# Patient Record
Sex: Male | Born: 1975 | Race: Black or African American | Hispanic: No | Marital: Single | Smoking: Former smoker
Health system: Southern US, Community
[De-identification: ages and names within clinical notes are randomized; demographics above are authoritative.]

## PROBLEM LIST (undated history)

## (undated) DIAGNOSIS — I319 Disease of pericardium, unspecified: Secondary | ICD-10-CM

## (undated) DIAGNOSIS — J45909 Unspecified asthma, uncomplicated: Secondary | ICD-10-CM

## (undated) HISTORY — PX: OTHER SURGICAL HISTORY: SHX169

---

## 1998-12-22 ENCOUNTER — Emergency Department (HOSPITAL_COMMUNITY): Admission: EM | Admit: 1998-12-22 | Discharge: 1998-12-22 | Payer: Self-pay | Admitting: Emergency Medicine

## 1999-02-26 ENCOUNTER — Encounter: Payer: Self-pay | Admitting: Emergency Medicine

## 1999-02-26 ENCOUNTER — Emergency Department (HOSPITAL_COMMUNITY): Admission: EM | Admit: 1999-02-26 | Discharge: 1999-02-26 | Payer: Self-pay | Admitting: Emergency Medicine

## 2000-08-30 ENCOUNTER — Encounter: Payer: Self-pay | Admitting: Emergency Medicine

## 2000-08-30 ENCOUNTER — Emergency Department (HOSPITAL_COMMUNITY): Admission: EM | Admit: 2000-08-30 | Discharge: 2000-08-30 | Payer: Self-pay | Admitting: Emergency Medicine

## 2001-04-06 ENCOUNTER — Emergency Department (HOSPITAL_COMMUNITY): Admission: EM | Admit: 2001-04-06 | Discharge: 2001-04-06 | Payer: Self-pay | Admitting: *Deleted

## 2001-04-06 ENCOUNTER — Encounter: Payer: Self-pay | Admitting: Emergency Medicine

## 2001-04-08 ENCOUNTER — Emergency Department (HOSPITAL_COMMUNITY): Admission: EM | Admit: 2001-04-08 | Discharge: 2001-04-08 | Payer: Self-pay | Admitting: Emergency Medicine

## 2001-06-27 ENCOUNTER — Encounter: Payer: Self-pay | Admitting: Emergency Medicine

## 2001-06-27 ENCOUNTER — Emergency Department (HOSPITAL_COMMUNITY): Admission: EM | Admit: 2001-06-27 | Discharge: 2001-06-27 | Payer: Self-pay | Admitting: Emergency Medicine

## 2002-11-24 ENCOUNTER — Encounter: Payer: Self-pay | Admitting: Emergency Medicine

## 2002-11-24 ENCOUNTER — Emergency Department (HOSPITAL_COMMUNITY): Admission: EM | Admit: 2002-11-24 | Discharge: 2002-11-24 | Payer: Self-pay | Admitting: Emergency Medicine

## 2003-03-15 ENCOUNTER — Emergency Department (HOSPITAL_COMMUNITY): Admission: EM | Admit: 2003-03-15 | Discharge: 2003-03-15 | Payer: Self-pay

## 2003-09-06 ENCOUNTER — Emergency Department (HOSPITAL_COMMUNITY): Admission: EM | Admit: 2003-09-06 | Discharge: 2003-09-06 | Payer: Self-pay | Admitting: Emergency Medicine

## 2003-09-28 ENCOUNTER — Emergency Department (HOSPITAL_COMMUNITY): Admission: EM | Admit: 2003-09-28 | Discharge: 2003-09-28 | Payer: Self-pay | Admitting: Emergency Medicine

## 2003-11-30 ENCOUNTER — Emergency Department (HOSPITAL_COMMUNITY): Admission: EM | Admit: 2003-11-30 | Discharge: 2003-12-01 | Payer: Self-pay | Admitting: Emergency Medicine

## 2004-03-09 ENCOUNTER — Emergency Department (HOSPITAL_COMMUNITY): Admission: AC | Admit: 2004-03-09 | Discharge: 2004-03-10 | Payer: Self-pay

## 2005-02-20 ENCOUNTER — Emergency Department (HOSPITAL_COMMUNITY): Admission: EM | Admit: 2005-02-20 | Discharge: 2005-02-20 | Payer: Self-pay | Admitting: Emergency Medicine

## 2005-06-02 ENCOUNTER — Emergency Department (HOSPITAL_COMMUNITY): Admission: EM | Admit: 2005-06-02 | Discharge: 2005-06-03 | Payer: Self-pay | Admitting: Emergency Medicine

## 2008-06-20 ENCOUNTER — Emergency Department (HOSPITAL_COMMUNITY): Admission: EM | Admit: 2008-06-20 | Discharge: 2008-06-21 | Payer: Self-pay | Admitting: Emergency Medicine

## 2009-10-27 ENCOUNTER — Emergency Department (HOSPITAL_COMMUNITY): Admission: EM | Admit: 2009-10-27 | Discharge: 2009-10-27 | Payer: Self-pay | Admitting: Emergency Medicine

## 2010-12-01 LAB — HEMOCCULT GUIAC POC 1CARD (OFFICE): Fecal Occult Bld: NEGATIVE

## 2010-12-31 ENCOUNTER — Emergency Department (HOSPITAL_COMMUNITY)
Admission: EM | Admit: 2010-12-31 | Discharge: 2010-12-31 | Disposition: A | Discharge status: Home / Self Care | Payer: Self-pay | Attending: Emergency Medicine | Admitting: Emergency Medicine

## 2010-12-31 DIAGNOSIS — K089 Disorder of teeth and supporting structures, unspecified: Secondary | ICD-10-CM | POA: Insufficient documentation

## 2010-12-31 DIAGNOSIS — J45909 Unspecified asthma, uncomplicated: Secondary | ICD-10-CM | POA: Insufficient documentation

## 2010-12-31 DIAGNOSIS — K029 Dental caries, unspecified: Secondary | ICD-10-CM | POA: Insufficient documentation

## 2011-08-19 ENCOUNTER — Emergency Department (INDEPENDENT_AMBULATORY_CARE_PROVIDER_SITE_OTHER)
Admission: EM | Admit: 2011-08-19 | Discharge: 2011-08-19 | Disposition: A | Discharge status: Home / Self Care | Payer: Self-pay | Source: Home / Self Care | Attending: Family Medicine | Admitting: Family Medicine

## 2011-08-19 DIAGNOSIS — M25512 Pain in left shoulder: Secondary | ICD-10-CM

## 2011-08-19 DIAGNOSIS — M25519 Pain in unspecified shoulder: Secondary | ICD-10-CM

## 2011-08-19 MED ORDER — PREDNISONE (PAK) 10 MG PO TABS: 10.0000 mg | ORAL_TABLET | Freq: Every day | ORAL | Status: AC

## 2011-08-19 MED ORDER — ACETAMINOPHEN-CODEINE #3 300-30 MG PO TABS: 1.0000 | ORAL_TABLET | Freq: Four times a day (QID) | ORAL | Status: AC | PRN

## 2011-08-19 NOTE — ED Notes (Signed)
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

## 2011-08-19 NOTE — ED Provider Notes (Signed)
History     CSN: [id number] Arrival date & time: 08/19/2011  8:23 PM   First MD Initiated Contact with Patient 08/19/11 1956      Chief Complaint  Patient presents with  . Shoulder Pain    (Consider location/radiation/quality/duration/timing/severity/associated sxs/prior treatment) HPI Comments: Left shoulder pain. States he has had shoulder problems for years. He is left hand dominant. 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 States his shoulder is very painful . He was trying to work thru it. No numbness or tingling. No specific injury. No treatment pta  The history is provided by the patient.    History reviewed. No pertinent past medical history.  History reviewed. No pertinent past surgical history.  History reviewed. No pertinent family history.  History  Substance Use Topics  . Smoking status: Never Smoker   . Smokeless tobacco: Not on file  . Alcohol Use:       Review of Systems  Constitutional: Negative.   HENT: Negative.   Respiratory: Negative.   Cardiovascular: Negative.   Gastrointestinal: Negative.   Genitourinary: Negative.   Neurological: Negative.     Allergies  Review of patient's allergies indicates no known allergies.  Home Medications   Current Outpatient Rx  Name Route Sig Dispense Refill  . ACETAMINOPHEN-CODEINE #3 300-30 MG PO TABS Oral Take 1-2 tablets by mouth every 6 (six) hours as needed for pain. 20 tablet 0  . PREDNISONE (PAK) 10 MG PO TABS Oral Take 1 tablet (10 mg total) by mouth daily. 6 day pak  Take as directed with food 21 tablet 0    BP 143/99  Pulse 81  Temp 98.4 F (36.9 C)  Resp 20  SpO2 99%  Physical Exam  Constitutional: He appears well-developed and well-nourished. No distress.  Cardiovascular: Normal rate and regular rhythm.   Pulmonary/Chest: Effort normal and breath sounds normal.  Musculoskeletal:       eval of the left shoulder reveals no deformity. Pain with abduction and internal  rotation. Good grip strength. Skin clear. Sensory intact.     ED Course  Procedures (including critical care time) Do not feel xray of benefit at this point. May need mri. Discussed this with the patient and he voiced understanding.  Labs Reviewed - No data to display No results found.   1. Shoulder pain, left       MDM          Randa Spike, MD 08/19/11 2102

## 2011-10-14 ENCOUNTER — Emergency Department (INDEPENDENT_AMBULATORY_CARE_PROVIDER_SITE_OTHER)
Admission: EM | Admit: 2011-10-14 | Discharge: 2011-10-14 | Disposition: A | Discharge status: Home / Self Care | Payer: Self-pay | Source: Home / Self Care | Attending: Family Medicine | Admitting: Family Medicine

## 2011-10-14 ENCOUNTER — Encounter (HOSPITAL_COMMUNITY): Payer: Self-pay

## 2011-10-14 DIAGNOSIS — R51 Headache: Secondary | ICD-10-CM

## 2011-10-14 NOTE — ED Provider Notes (Signed)
History     CSN: [id number]  Arrival date & time 10/14/11  1648   First MD Initiated Contact with Patient 10/14/11 1658      Chief Complaint  Patient presents with  . Headache    (Consider location/radiation/quality/duration/timing/severity/associated sxs/prior treatment) HPI Comments: Jeret presents for evaluation of new onset headaches that started 4 weeks ago. He also reports mild nausea. He denies any visual changes. He denies vomiting. He states he thinks the headaches start after he eats a meal. He denies any numbness, tingling, or weakness. He denies any history of headaches, migraine or otherwise. He also states that he gets a little dizzy when he stands up sometimes after sitting for long periods of time.   Patient is a 36 y.o. male presenting with headaches. The history is provided by the patient.  Headache The primary symptoms include headaches, dizziness and nausea. Primary symptoms do not include syncope, focal weakness or vomiting. The symptoms began more than 1 week ago.  The headache began more than 2 days ago. The headache developed suddenly. Headache is a new problem. The headache is present intermittently. Location/region(s) of the headache: frontal. The headache is associated with eating.  Dizziness also occurs with nausea. Dizziness does not occur with vomiting.     History reviewed. No pertinent past medical history.  History reviewed. No pertinent past surgical history.  No family history on file.  History  Substance Use Topics  . Smoking status: Never Smoker   . Smokeless tobacco: Not on file  . Alcohol Use:       Review of Systems  Constitutional: Negative.   Eyes: Negative.   Respiratory: Negative.   Cardiovascular: Negative.  Negative for syncope.  Gastrointestinal: Positive for nausea. Negative for vomiting.  Genitourinary: Negative.   Musculoskeletal: Negative.   Skin: Negative.   Neurological: Positive for dizziness and headaches. Negative  for focal weakness.    Allergies  Review of patient's allergies indicates no known allergies.  Home Medications  No current outpatient prescriptions on file.  BP 118/84  Pulse 90  Temp(Src) 98.3 F (36.8 C) (Oral)  Resp 16  SpO2 100%  Physical Exam  Nursing note and vitals reviewed. Constitutional: He is oriented to person, place, and time. He appears well-developed and well-nourished.  HENT:  Head: Normocephalic and atraumatic.  Right Ear: Tympanic membrane normal.  Left Ear: Tympanic membrane normal.  Mouth/Throat: Uvula is midline, oropharynx is clear and moist and mucous membranes are normal.  Eyes: Conjunctivae, EOM and lids are normal. Pupils are equal, round, and reactive to light.  Neck: Normal range of motion.  Pulmonary/Chest: Effort normal.  Musculoskeletal: Normal range of motion.  Neurological: He is alert and oriented to person, place, and time.  Skin: Skin is warm and dry.  Psychiatric: His behavior is normal.    ED Course  Procedures (including critical care time)  Labs Reviewed - No data to display No results found.   1. Headache       MDM  Advised Excedrin Migraine and ibuprofen; return if symptoms do not improve, or worsen.       Richardo Priest, MD 10/14/11 1753

## 2011-10-14 NOTE — ED Notes (Signed)
C/o HA , off and on for past 4 weeks; pain feels like a pounding sensation, using motrin foe pain w minimal relief; NAD; c/o sometimes he has dizziness w his HA when he gets up too fast; "  I know I don't drink enough water"

## 2012-11-22 ENCOUNTER — Emergency Department (HOSPITAL_COMMUNITY)
Admission: EM | Admit: 2012-11-22 | Discharge: 2012-11-22 | Disposition: A | Discharge status: Home / Self Care | Payer: Self-pay | Attending: Emergency Medicine | Admitting: Emergency Medicine

## 2012-11-22 ENCOUNTER — Encounter (HOSPITAL_COMMUNITY): Payer: Self-pay | Admitting: Emergency Medicine

## 2012-11-22 DIAGNOSIS — F17200 Nicotine dependence, unspecified, uncomplicated: Secondary | ICD-10-CM | POA: Insufficient documentation

## 2012-11-22 DIAGNOSIS — L723 Sebaceous cyst: Secondary | ICD-10-CM | POA: Insufficient documentation

## 2012-11-22 DIAGNOSIS — L729 Follicular cyst of the skin and subcutaneous tissue, unspecified: Secondary | ICD-10-CM

## 2012-11-22 NOTE — ED Provider Notes (Signed)
History     CSN: [id number]  Arrival date & time 11/22/12  1325   First MD Initiated Contact with Patient 11/22/12 1353      Chief Complaint  Patient presents with  . Breast Mass    (Consider location/radiation/quality/duration/timing/severity/associated sxs/prior treatment) HPI Comments: Patient presents with breast mass on the left chest for approximately a year. Notes to be minimally tender at times. Wife was worried about breast cancer. The area is not increasing in size. It has not drained or been red. Patient denies any medications. He denies any testicular masses or abnormalities. No weight loss. Night sweats noted at times. Patient does not have primary care physician. The onset of this condition was acute. The course is constant. Aggravating factors: none. Alleviating factors: none.    The history is provided by the patient and the spouse.    History reviewed. No pertinent past medical history.  History reviewed. No pertinent past surgical history.  History reviewed. No pertinent family history.  History  Substance Use Topics  . Smoking status: Current Some Day Smoker  . Smokeless tobacco: Not on file  . Alcohol Use: No      Review of Systems  Constitutional: Negative for fever and unexpected weight change.  Gastrointestinal: Negative for nausea and vomiting.  Genitourinary: Negative for penile swelling, scrotal swelling and testicular pain.  Musculoskeletal: Negative for myalgias.  Skin: Negative for color change and rash.    Allergies  Review of patient's allergies indicates no known allergies.  Home Medications  No current outpatient prescriptions on file.  BP 137/91  Pulse 84  Temp(Src) 97.8 F (36.6 C) (Oral)  Resp 20  SpO2 98%  Physical Exam  Nursing note and vitals reviewed. Constitutional: He appears well-developed and well-nourished.  HENT:  Head: Normocephalic and atraumatic.  Eyes: Conjunctivae are normal.  Neck: Normal range of  motion. Neck supple.  Pulmonary/Chest: No respiratory distress. He exhibits no tenderness.  Neurological: He is alert.  Skin: Skin is warm and dry.  Approximately 5 mm subcutaneous nodule without overlying erythema or drainage noted to anterior left chest wall. Nodule is freely movable and nontender. It has a rubbery consistency. No masses palpated beneath the nipple and areola.   Psychiatric: He has a normal mood and affect.    ED Course  Procedures (including critical care time)  Labs Reviewed - No data to display No results found.   1. Subcutaneous cyst     2:00 PM Patient seen and examined.    Vital signs reviewed and are as follows: Filed Vitals:   11/22/12 1339  BP: 137/91  Pulse: 84  Temp: 97.8 F (36.6 C)  Resp: 20   Patient counseled on monitoring and encouraged PCP or dermatology followup. Referrals given.   MDM  Patient with most likely an epidermal subcutaneous cyst. No infections noted. No drainage. Cyst is soft, nontender and freely mobile. Do not suspect breast cancer. Do not suspect gynecomastia.       Renne Crigler, PA-C 11/22/12 1415

## 2012-11-22 NOTE — ED Provider Notes (Signed)
Medical screening examination/treatment/procedure(s) were performed by non-physician practitioner and as supervising physician I was immediately available for consultation/collaboration.  Marwan T Powers, MD 11/22/12 1642 

## 2012-11-22 NOTE — ED Notes (Signed)
Pt reports eraser size round mass to left breast for the last year. Pt states intermittently painful. Denies drainage or fever.

## 2015-06-04 ENCOUNTER — Encounter (HOSPITAL_COMMUNITY): Payer: Self-pay | Admitting: Emergency Medicine

## 2015-06-04 ENCOUNTER — Emergency Department (HOSPITAL_COMMUNITY)
Admission: EM | Admit: 2015-06-04 | Discharge: 2015-06-04 | Disposition: A | Discharge status: Home / Self Care | Payer: Self-pay | Attending: Emergency Medicine | Admitting: Emergency Medicine

## 2015-06-04 ENCOUNTER — Emergency Department (HOSPITAL_COMMUNITY): Payer: Self-pay

## 2015-06-04 DIAGNOSIS — Z720 Tobacco use: Secondary | ICD-10-CM | POA: Insufficient documentation

## 2015-06-04 DIAGNOSIS — J45909 Unspecified asthma, uncomplicated: Secondary | ICD-10-CM | POA: Insufficient documentation

## 2015-06-04 DIAGNOSIS — F419 Anxiety disorder, unspecified: Secondary | ICD-10-CM | POA: Insufficient documentation

## 2015-06-04 DIAGNOSIS — R0789 Other chest pain: Secondary | ICD-10-CM | POA: Insufficient documentation

## 2015-06-04 HISTORY — DX: Unspecified asthma, uncomplicated: J45.909

## 2015-06-04 LAB — COMPREHENSIVE METABOLIC PANEL
ALBUMIN: 3.8 g/dL (ref 3.5–5.0)
ALK PHOS: 49 U/L (ref 38–126)
ALT: 18 U/L (ref 17–63)
AST: 21 U/L (ref 15–41)
Anion gap: 6 (ref 5–15)
BILIRUBIN TOTAL: 0.4 mg/dL (ref 0.3–1.2)
BUN: 11 mg/dL (ref 6–20)
CO2: 25 mmol/L (ref 22–32)
Calcium: 9.2 mg/dL (ref 8.9–10.3)
Chloride: 108 mmol/L (ref 101–111)
Creatinine, Ser: 1.11 mg/dL (ref 0.61–1.24)
GFR calc Af Amer: >60 mL/min (ref >60)
GFR calc non Af Amer: >60 mL/min (ref >60)
GLUCOSE: 103 mg/dL — AB (ref 65–99)
POTASSIUM: 3.4 mmol/L — AB (ref 3.5–5.1)
SODIUM: 139 mmol/L (ref 135–145)
TOTAL PROTEIN: 7.2 g/dL (ref 6.5–8.1)

## 2015-06-04 LAB — URINALYSIS, ROUTINE W REFLEX MICROSCOPIC
Bilirubin Urine: NEGATIVE
Glucose, UA: NEGATIVE mg/dL
HGB URINE DIPSTICK: NEGATIVE
Ketones, ur: NEGATIVE mg/dL
LEUKOCYTES UA: NEGATIVE
Nitrite: NEGATIVE
PROTEIN: NEGATIVE mg/dL
SPECIFIC GRAVITY, URINE: 1.023 (ref 1.005–1.030)
UROBILINOGEN UA: 1 mg/dL (ref 0.0–1.0)
pH: 6.5 (ref 5.0–8.0)

## 2015-06-04 LAB — RAPID URINE DRUG SCREEN, HOSP PERFORMED
AMPHETAMINES: NOT DETECTED
BENZODIAZEPINES: NOT DETECTED
Barbiturates: NOT DETECTED
COCAINE: NOT DETECTED
OPIATES: NOT DETECTED
TETRAHYDROCANNABINOL: NOT DETECTED

## 2015-06-04 LAB — CBC WITH DIFFERENTIAL/PLATELET
BASOS ABS: 0 10*3/uL (ref 0.0–0.1)
BASOS PCT: 1 %
EOS ABS: 0.1 10*3/uL (ref 0.0–0.7)
Eosinophils Relative: 2 %
HEMATOCRIT: 41 % (ref 39.0–52.0)
HEMOGLOBIN: 14.4 g/dL (ref 13.0–17.0)
Lymphocytes Relative: 39 %
Lymphs Abs: 1.7 10*3/uL (ref 0.7–4.0)
MCH: 31.3 pg (ref 26.0–34.0)
MCHC: 35.1 g/dL (ref 30.0–36.0)
MCV: 89.1 fL (ref 78.0–100.0)
Monocytes Absolute: 0.3 10*3/uL (ref 0.1–1.0)
Monocytes Relative: 8 %
NEUTROS ABS: 2.2 10*3/uL (ref 1.7–7.7)
NEUTROS PCT: 50 %
Platelets: 231 10*3/uL (ref 150–400)
RBC: 4.6 MIL/uL (ref 4.22–5.81)
RDW: 12.4 % (ref 11.5–15.5)
WBC: 4.4 10*3/uL (ref 4.0–10.5)

## 2015-06-04 LAB — I-STAT TROPONIN, ED
Troponin i, poc: 0 ng/mL (ref 0.00–0.08)
Troponin i, poc: 0 ng/mL (ref 0.00–0.08)

## 2015-06-04 MED ORDER — KETOROLAC TROMETHAMINE 30 MG/ML IJ SOLN
30.0000 mg | Freq: Once | INTRAMUSCULAR | Status: AC
  Administered 2015-06-04: 30 mg via INTRAVENOUS
  Filled 2015-06-04: qty 1

## 2015-06-04 MED ORDER — ALBUTEROL SULFATE HFA 108 (90 BASE) MCG/ACT IN AERS
1.0000 | INHALATION_SPRAY | Freq: Once | RESPIRATORY_TRACT | Status: AC
  Administered 2015-06-04: 1 via RESPIRATORY_TRACT
  Filled 2015-06-04: qty 6.7

## 2015-06-04 MED ORDER — CYCLOBENZAPRINE HCL 10 MG PO TABS: 10.0000 mg | ORAL_TABLET | Freq: Two times a day (BID) | ORAL | Status: DC | PRN

## 2015-06-04 MED ORDER — IBUPROFEN 600 MG PO TABS: 600.0000 mg | ORAL_TABLET | Freq: Four times a day (QID) | ORAL | Status: DC | PRN

## 2015-06-04 NOTE — ED Notes (Signed)
Per EMS: cp onset 0900, constant, non-radiating but pt reports left arm aching, states its hard to breath at times, been under a lot of stress at work and at home.  VSS, no cardiac hx, states he was diaphoretic at work today.  Hx of asthma

## 2015-06-04 NOTE — Discharge Instructions (Signed)

## 2015-06-04 NOTE — ED Provider Notes (Signed)
CSN: [id number]     Arrival date & time 06/04/15  1704 History   First MD Initiated Contact with Patient 06/04/15 1705     Chief Complaint  Patient presents with  . Chest Pain   Patient is a 39 y.o. male presenting with general illness. The history is provided by the patient. No language interpreter was used.  Illness Location:  Chest Quality:  Pain Severity:  Moderate Onset quality:  Gradual Timing:  Constant Progression:  Unchanged Chronicity:  New Context:  PMHx of asthma presenting with CP. Onset at 9am this morning while at rest. Nonradiating. Not exertional. Worse with deep inspiration, movement of arm, and palpation. PERC (-). Denies SOB, nausea, diaphoresis. No PMHx of HTN, HLD, DM, or TOB use. No FHx of early MI. Known asthma as noted above but denies recent cough or wheezing and patient states this does not feel like his asthma flares. Patient has been under increased stress at work and at home recently. Associated symptoms: chest pain and myalgias (L arm)   Associated symptoms: no cough, no diarrhea, no fever, no nausea, no rash, no shortness of breath and no vomiting     Past Medical History  Diagnosis Date  . Asthma    No past surgical history on file. No family history on file. Social History  Substance Use Topics  . Smoking status: Current Some Day Smoker  . Smokeless tobacco: None  . Alcohol Use: No    Review of Systems  Constitutional: Negative for fever and chills.  Respiratory: Negative for cough and shortness of breath.   Cardiovascular: Positive for chest pain. Negative for palpitations and leg swelling.  Gastrointestinal: Negative for nausea, vomiting, diarrhea, constipation and abdominal distention.  Genitourinary: Negative for dysuria, urgency, frequency and hematuria.  Musculoskeletal: Positive for myalgias (L arm) and arthralgias (L arm). Negative for back pain, joint swelling and gait problem.  Skin: Negative for rash.  All other systems reviewed  and are negative.   Allergies  Review of patient's allergies indicates no known allergies.  Home Medications   Prior to Admission medications   Medication Sig Start Date End Date Taking? Authorizing Zan Orlick  cyclobenzaprine (FLEXERIL) 10 MG tablet Take 1 tablet (10 mg total) by mouth 2 (two) times daily as needed for muscle spasms. 06/04/15   Angelina Ok, MD  ibuprofen (ADVIL,MOTRIN) 600 MG tablet Take 1 tablet (600 mg total) by mouth every 6 (six) hours as needed. 06/04/15   Angelina Ok, MD   BP 117/85 mmHg  Pulse 74  Temp(Src) 98.3 F (36.8 C) (Oral)  Resp 18  Ht  (1.778 m)  Wt 185 lb (83.915 kg)  BMI 26.54 kg/m2  SpO2 100%   Physical Exam  Constitutional: He is oriented to person, place, and time. He appears well-developed and well-nourished. No distress.  HENT:  Head: Normocephalic and atraumatic.  Eyes: Conjunctivae are normal. Pupils are equal, round, and reactive to light.  Neck: Normal range of motion. Neck supple.  Cardiovascular: Normal rate, regular rhythm and intact distal pulses.   Pulmonary/Chest: Effort normal and breath sounds normal. No respiratory distress. He has no wheezes. He exhibits tenderness (L costochondral junction as well as entire left pectoralis muscle).  Abdominal: Soft. Bowel sounds are normal. He exhibits no distension. There is no tenderness.  Musculoskeletal: Normal range of motion.  Neurological: He is alert and oriented to person, place, and time.  Skin: Skin is warm and dry. He is not diaphoretic.  Nursing note and vitals reviewed.  ED Course  Procedures   Labs Review Labs Reviewed  COMPREHENSIVE METABOLIC PANEL - Abnormal; Notable for the following:    Potassium 3.4 (*)    Glucose, Bld 103 (*)    All other components within normal limits  CBC WITH DIFFERENTIAL/PLATELET  URINALYSIS, ROUTINE W REFLEX MICROSCOPIC (NOT AT [facility])  URINE RAPID DRUG SCREEN, HOSP PERFORMED  I-STAT TROPOININ, ED  Rosezena Sensor, ED     Imaging Review Dg Chest 2 View  06/04/2015   CLINICAL DATA:  Chest pain x 1 day  EXAM: CHEST  2 VIEW  COMPARISON:  06/21/2008  FINDINGS: The heart size and mediastinal contours are within normal limits. Both lungs are clear. The visualized skeletal structures are unremarkable.  IMPRESSION: No active cardiopulmonary disease.   Electronically Signed   By: Norva Pavlov M.D.   On: 06/04/2015 18:08   I have personally reviewed and evaluated these images and lab results as part of my medical decision-making.   EKG Interpretation   Date/Time:  Thursday June 04 2015 17:09:03 EDT Ventricular Rate:  89 PR Interval:  180 QRS Duration: 84 QT Interval:  324 QTC Calculation: 394 R Axis:   52 Text Interpretation:  Sinus rhythm Artifact in lead(s) I II III aVR aVL  aVF V1 V2 No significant change since last tracing Confirmed by Gwendolyn Grant   MD, BLAIR ([id number]) on 06/04/2015 5:11:46 PM      MDM  Mr. Docken is a 39 yo male w/ PMHx of asthma presenting with CP. Onset at 9am this morning while at rest. Nonradiating. Not exertional. Worse with deep inspiration, movement of arm, and palpation. PERC (-). Denies SOB, nausea, diaphoresis. No PMHx of HTN, HLD, DM, or TOB use. No FHx of early MI. Known asthma as noted above but denies recent cough or wheezing and patient states this does not feel like his asthma flares. Patient has been under increased stress at work and at home recently.  Exam above notable for middle-age male lying in stretcher in no acute distress. Afebrile. Heart rate 80s. Normotensive. Breathing well on room air and maintaining saturations in high 90s. Lungs clear to auscultation bilaterally. Tenderness to palpation of left costochondral junction as well as left pectoralis muscle. Patient's pain reproducible with range of motion of left shoulder.  Patient risk stratified to low risk using heart score. EKG showing no ST elevation/depression or T-wave inversions and unchanged from previous  EKG. I-STAT troponin undetectable and follow-up troponin 3 hours following is undetectable. CBC and CMP noncontributory. UA negative for blood or severe dehydration or infection. UDS negative. Chest x-ray showing no acute carpal disease.  Symptoms most likely related to musculoskeletal chest pain versus anxiety. Patient given Toradol in the ED with moderate relief in pain. Patient discharged home with prescription for muscle relaxants and a referral to PCP to further discuss medication regimen to control his anxiety. Strict ED return precautions dicussed. Pt understands and agrees with the plan and has no further questions or concerns.   Pt care discussed with and followed by my attending, Dr. Thurmon Fair, MD Pager [phone]   Final diagnoses:  Left-sided chest wall pain  Anxiety    Angelina Ok, MD 06/04/15 2340  Elwin Mocha, MD 06/05/15 [phone]

## 2015-06-04 NOTE — ED Notes (Signed)
Pt. Left with all belongings and refused wheelchair. Discharge instructions were reviewed and all questions were answered.  

## 2015-06-04 NOTE — ED Notes (Signed)
  Patient returned from xray, monitor applied.

## 2016-10-15 IMAGING — DX DG CHEST 2V
2 series · 2 of 2 positions shown · non-contrast
Comparison: 06/21/2008

CLINICAL DATA: Chest pain x 1 day

EXAM:
CHEST  2 VIEW

[chest pa]
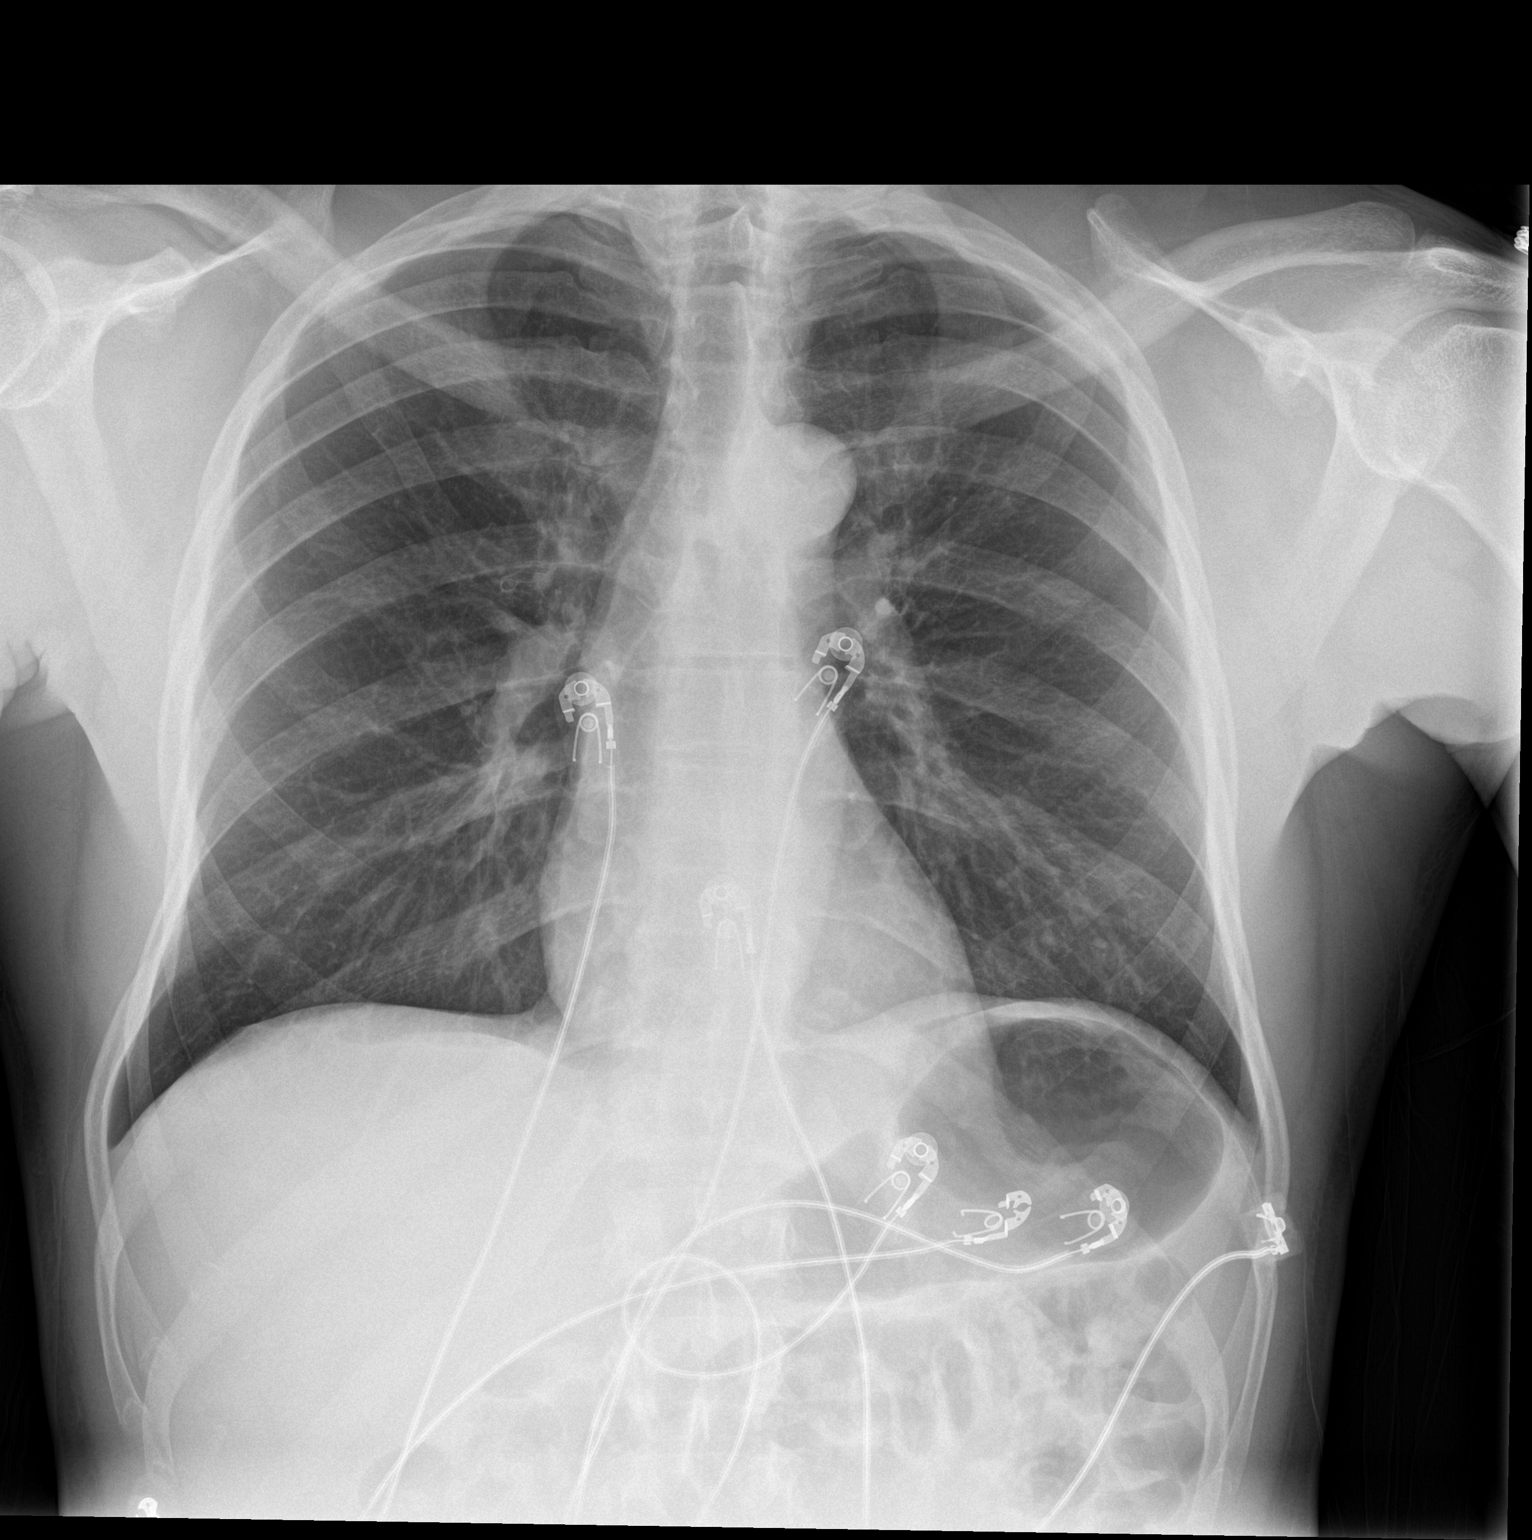

[chest lat]
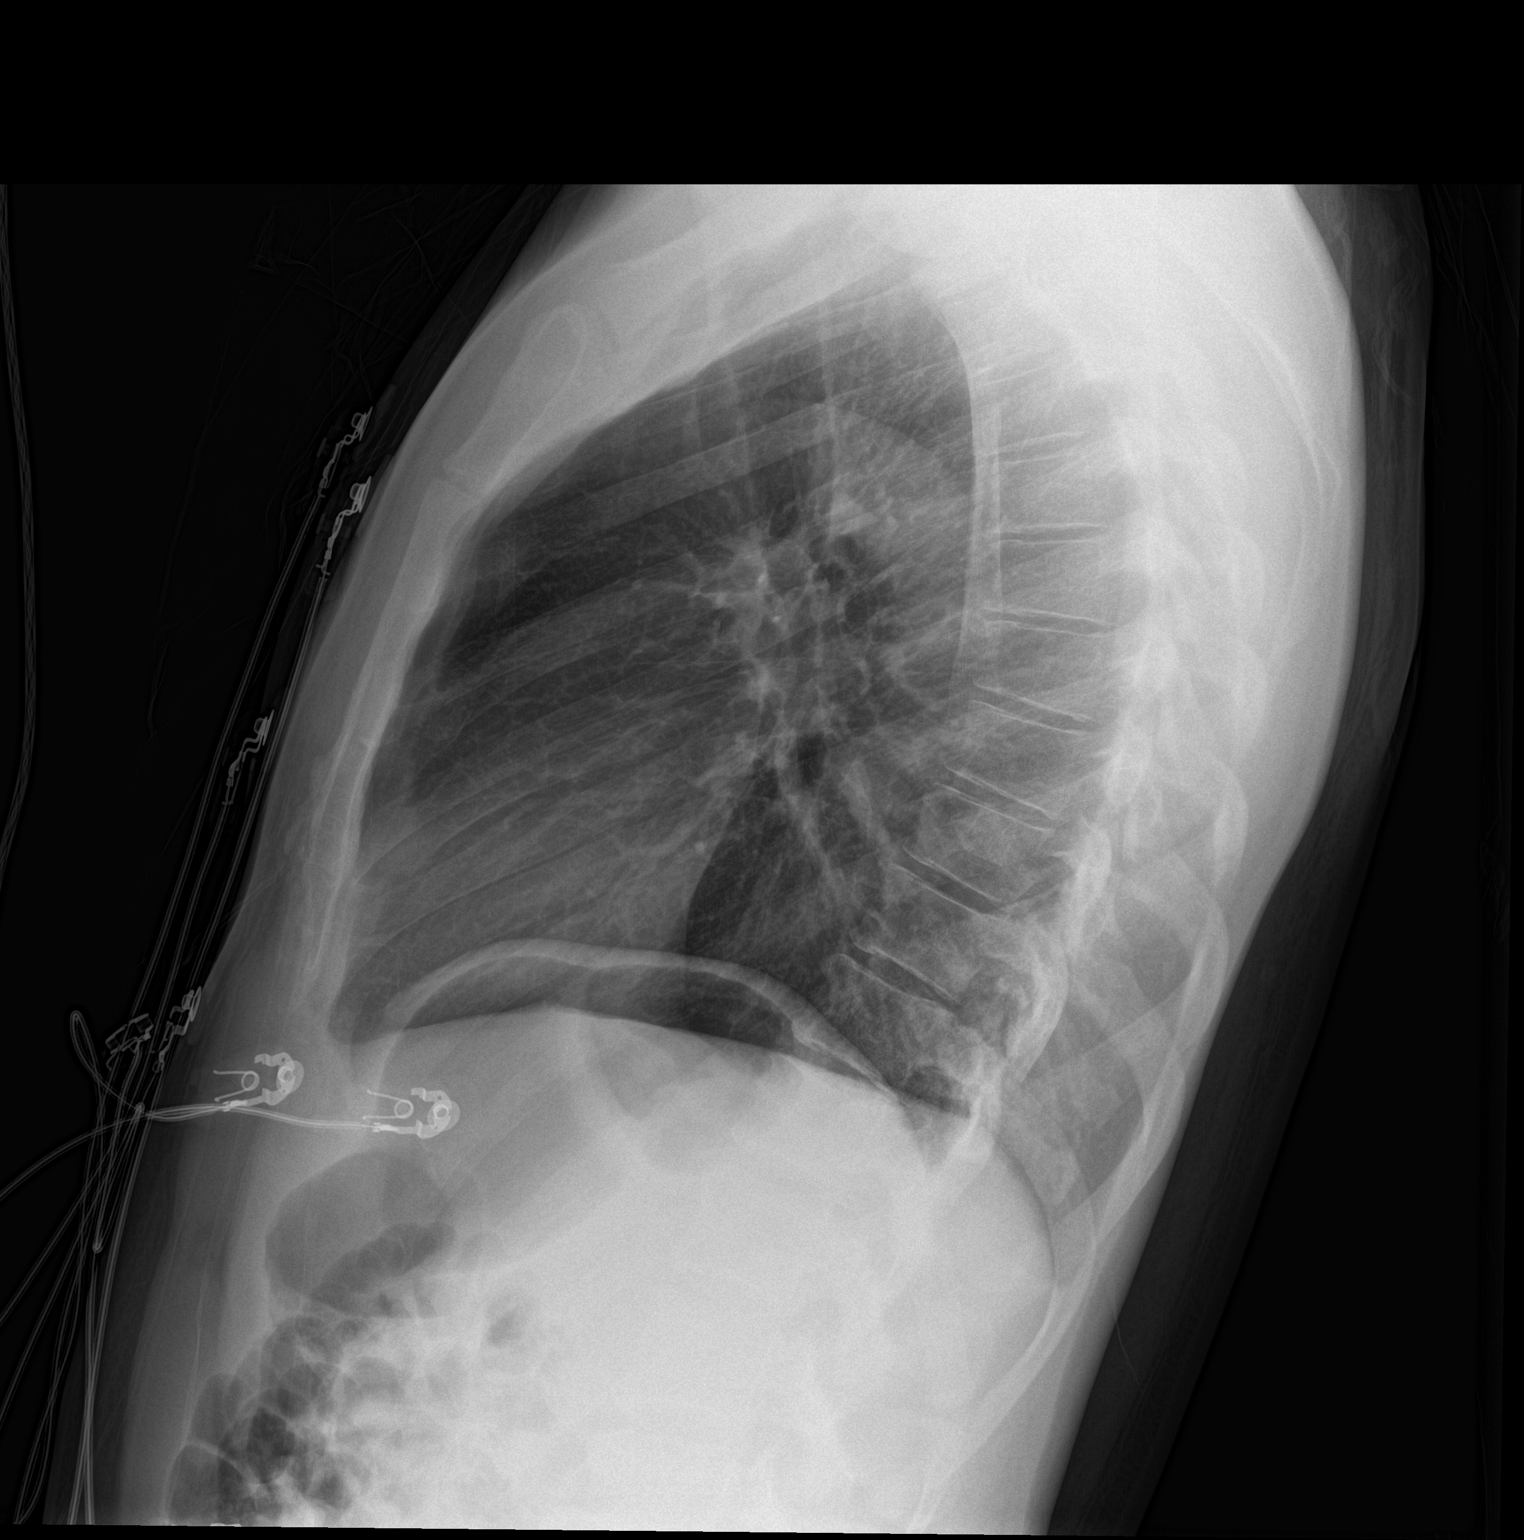

[2 of 2 positions shown; findings below may reference images not displayed]

FINDINGS: The heart size and mediastinal contours are within normal limits.
Both lungs are clear. The visualized skeletal structures are
unremarkable.
IMPRESSION: No active cardiopulmonary disease.

## 2017-05-02 ENCOUNTER — Emergency Department (HOSPITAL_COMMUNITY)
Admission: EM | Admit: 2017-05-02 | Discharge: 2017-05-02 | Disposition: A | Discharge status: Home Health | Payer: BLUE CROSS/BLUE SHIELD | Attending: Emergency Medicine | Admitting: Emergency Medicine

## 2017-05-02 ENCOUNTER — Emergency Department (HOSPITAL_COMMUNITY): Payer: BLUE CROSS/BLUE SHIELD

## 2017-05-02 ENCOUNTER — Encounter (HOSPITAL_COMMUNITY): Payer: Self-pay | Admitting: Cardiology

## 2017-05-02 DIAGNOSIS — I319 Disease of pericardium, unspecified: Secondary | ICD-10-CM | POA: Insufficient documentation

## 2017-05-02 DIAGNOSIS — R079 Chest pain, unspecified: Secondary | ICD-10-CM | POA: Diagnosis present

## 2017-05-02 DIAGNOSIS — Z87891 Personal history of nicotine dependence: Secondary | ICD-10-CM | POA: Insufficient documentation

## 2017-05-02 DIAGNOSIS — J45909 Unspecified asthma, uncomplicated: Secondary | ICD-10-CM | POA: Diagnosis not present

## 2017-05-02 LAB — D-DIMER, QUANTITATIVE: D-Dimer, Quant: 0.29 ug/mL-FEU (ref 0.00–0.50)

## 2017-05-02 LAB — COMPREHENSIVE METABOLIC PANEL
ALBUMIN: 3.9 g/dL (ref 3.5–5.0)
ALK PHOS: 61 U/L (ref 38–126)
ALT: 28 U/L (ref 17–63)
AST: 27 U/L (ref 15–41)
Anion gap: 5 (ref 5–15)
BILIRUBIN TOTAL: 0.5 mg/dL (ref 0.3–1.2)
BUN: 14 mg/dL (ref 6–20)
CALCIUM: 8.7 mg/dL — AB (ref 8.9–10.3)
CO2: 26 mmol/L (ref 22–32)
Chloride: 107 mmol/L (ref 101–111)
Creatinine, Ser: 1.26 mg/dL — ABNORMAL HIGH (ref 0.61–1.24)
GFR calc Af Amer: >60 mL/min (ref >60)
GFR calc non Af Amer: >60 mL/min (ref >60)
GLUCOSE: 113 mg/dL — AB (ref 65–99)
Potassium: 3.9 mmol/L (ref 3.5–5.1)
Sodium: 138 mmol/L (ref 135–145)
TOTAL PROTEIN: 7.8 g/dL (ref 6.5–8.1)

## 2017-05-02 LAB — I-STAT TROPONIN, ED: Troponin i, poc: 0 ng/mL (ref 0.00–0.08)

## 2017-05-02 LAB — LIPASE, BLOOD: Lipase: 37 U/L (ref 11–51)

## 2017-05-02 MED ORDER — METHOCARBAMOL 500 MG PO TABS: 500.0000 mg | ORAL_TABLET | Freq: Three times a day (TID) | ORAL | 0 refills | Status: DC

## 2017-05-02 MED ORDER — COLCHICINE 0.6 MG PO TABS: 0.6000 mg | ORAL_TABLET | Freq: Every day | ORAL | 0 refills | Status: DC

## 2017-05-02 MED ORDER — ONDANSETRON HCL 4 MG PO TABS
4.0000 mg | ORAL_TABLET | Freq: Once | ORAL | Status: AC
  Administered 2017-05-02: 4 mg via ORAL
  Filled 2017-05-02: qty 1

## 2017-05-02 MED ORDER — TRAMADOL HCL 50 MG PO TABS
100.0000 mg | ORAL_TABLET | Freq: Once | ORAL | Status: AC
  Administered 2017-05-02: 100 mg via ORAL
  Filled 2017-05-02: qty 2

## 2017-05-02 MED ORDER — COLCHICINE 0.6 MG PO TABS
1.2000 mg | ORAL_TABLET | Freq: Once | ORAL | Status: AC
  Administered 2017-05-02: 1.2 mg via ORAL
  Filled 2017-05-02: qty 2

## 2017-05-02 MED ORDER — ASPIRIN 81 MG PO CHEW
324.0000 mg | CHEWABLE_TABLET | Freq: Once | ORAL | Status: AC
  Administered 2017-05-02: 324 mg via ORAL
  Filled 2017-05-02: qty 4

## 2017-05-02 MED ORDER — COLCHICINE 0.6 MG PO TABS
ORAL_TABLET | ORAL | Status: AC
  Filled 2017-05-02: qty 1

## 2017-05-02 MED ORDER — CYCLOBENZAPRINE HCL 10 MG PO TABS
10.0000 mg | ORAL_TABLET | Freq: Once | ORAL | Status: AC
  Administered 2017-05-02: 10 mg via ORAL
  Filled 2017-05-02: qty 1

## 2017-05-02 MED ORDER — MORPHINE SULFATE (PF) 4 MG/ML IV SOLN
4.0000 mg | Freq: Once | INTRAVENOUS | Status: AC
  Administered 2017-05-02: 4 mg via INTRAVENOUS
  Filled 2017-05-02: qty 1

## 2017-05-02 MED ORDER — TRAMADOL HCL 50 MG PO TABS: ORAL_TABLET | ORAL | 0 refills | Status: DC

## 2017-05-02 NOTE — Discharge Instructions (Signed)
Your EKG suggest pericarditis, a condition of inflammation around the heart. There is also question of inflammation of the chest wall. Please use colchicine daily with food. Use robaxin for muscle spasm pain. Use ultram for more severe pain. It is important that you call Dr Delton See to establish a primary MD concerning your blood pressure and kidney function, as well as monitor the pericarditis. The pericarditis should resolve in the next 5 to 7 days.

## 2017-05-02 NOTE — ED Provider Notes (Signed)
[facility]-EMER[facility]Y DEPT Provider Note   CSN: [id number] Arrival date & time: 05/02/17  9562     History   Chief Complaint No chief complaint on file.   HPI Tyrone Baker is a 41 y.o. male.  Patient is a 41 year old male who presents to the emergency department with a complaint of left chest pain.  The patient states this problem started approximately 11 PM last night. He states that it feels like a sharp stabbing pain accompanied by tightness in his chest. The pain does not radiate. He's had no recent injury or trauma to the chest. The patient denies being a smoker, using alcohol, or any recreational drugs. He does report some heavy lifting recently. No cough, fever or chills recently. He denies any medication changes, No hospitalizations, and no recent long trips or time of be sedentary.    The history is provided by the patient.    Past Medical History:  Diagnosis Date  . Asthma     There are no active problems to display for this patient.   History reviewed. No pertinent surgical history.     Home Medications    Prior to Admission medications   Medication Sig Start Date End Date Taking? Authorizing Provider  cyclobenzaprine (FLEXERIL) 10 MG tablet Take 1 tablet (10 mg total) by mouth 2 (two) times daily as needed for muscle spasms. 06/04/15   Angelina Ok, MD  ibuprofen (ADVIL,MOTRIN) 600 MG tablet Take 1 tablet (600 mg total) by mouth every 6 (six) hours as needed. 06/04/15   Angelina Ok, MD    Family History History reviewed. No pertinent family history.  Social History Social History  Substance Use Topics  . Smoking status: Former Games developer  . Smokeless tobacco: Never Used  . Alcohol use No     Allergies   Patient has no known allergies.   Review of Systems Review of Systems  Constitutional: Negative for activity change.       All ROS Neg except as noted in HPI  HENT: Negative for nosebleeds.   Eyes: Negative for photophobia and discharge.    Respiratory: Positive for chest tightness. Negative for cough, shortness of breath and wheezing.   Cardiovascular: Positive for chest pain. Negative for palpitations.  Gastrointestinal: Negative for abdominal pain and blood in stool.  Genitourinary: Negative for dysuria, frequency and hematuria.  Musculoskeletal: Negative for arthralgias, back pain and neck pain.  Skin: Negative.   Neurological: Negative for dizziness, seizures and speech difficulty.  Psychiatric/Behavioral: Negative for confusion and hallucinations.     Physical Exam Updated Vital Signs BP (!) 143/104 (BP Location: Right Arm)   Pulse 92   Temp 97.9 F (36.6 C) (Oral)   Resp 16   Ht 5\' 11"  (1.803 m)   Wt 91.2 kg (201 lb)   SpO2 97%   BMI 28.03 kg/m   Physical Exam  Constitutional: He appears well-developed and well-nourished. No distress.  HENT:  Head: Normocephalic and atraumatic.  Right Ear: External ear normal.  Left Ear: External ear normal.  Eyes: Conjunctivae are normal. Right eye exhibits no discharge. Left eye exhibits no discharge. No scleral icterus.  Neck: Neck supple. No tracheal deviation present.  Cardiovascular: Normal rate, regular rhythm and intact distal pulses.  Exam reveals no friction rub.   Pulmonary/Chest: Effort normal and breath sounds normal. No stridor. No respiratory distress. He has no wheezes. He has no rales.  Abdominal: Soft. Bowel sounds are normal. He exhibits no distension. There is no tenderness. There is no  rebound and no guarding.  Musculoskeletal: He exhibits no edema or tenderness.  Neurological: He is alert. He has normal strength. No cranial nerve deficit (no facial droop, extraocular movements intact, no slurred speech) or sensory deficit. He exhibits normal muscle tone. He displays no seizure activity. Coordination normal.  Skin: Skin is warm and dry. No rash noted.  Psychiatric: His mood appears anxious.  Nursing note and vitals reviewed.    ED Treatments /  Results  Labs (all labs ordered are listed, but only abnormal results are displayed) Labs Reviewed - No data to display  EKG  EKG Interpretation None       Radiology No results found.  Procedures Procedures (including critical care time)  CRITICAL CARE Performed by: Kathie Dike Total critical care time: *35** minutes Critical care time was exclusive of separately billable procedures and treating other patients. Critical care was necessary to treat or prevent imminent or life-threatening deterioration. Critical care was time spent personally by me on the following activities: development of treatment plan with patient and/or surrogate as well as nursing, discussions with consultants, evaluation of patient's response to treatment, examination of patient, obtaining history from patient or surrogate, ordering and performing treatments and interventions, ordering and review of laboratory studies, ordering and review of radiographic studies, pulse oximetry and re-evaluation of patient's condition. Medications Ordered in ED Medications - No data to display   Initial Impression / Assessment and Plan / ED Course  I have reviewed the triage vital signs and the nursing notes.  Pt discussed and reviewed  with Dr Estell Harpin.  Pertinent labs & imaging results that were available during my care of the patient were reviewed by me and considered in my medical decision making (see chart for details).       Final Clinical Impressions(s) / ED Diagnoses MDM I have reviewed the electrocardiogram. There is question of Acute Inferior Infarct. EKG reveals a sinus rhythm at 96 bpm. There are diffuse upward ST segments in multiple sites. And there is ST segment depression in aVR. I do not see reciprocal ST segment elevations at this time. Electrocardiogram reviewed with Dr. Estell Harpin.  Will repeat the electrocardiogram.  Blood pressure elevated, vital signs other wise wnl. Pulse ox 97% on room air.  WNL by my interpretation. I-STAT troponin is 0. Components of metabolic panel shows a creatinine to be elevated at 1.26, otherwise the panel was within normal limits. The lipase is normal at 37. The d-dimer test is negative for acute problem at 0.29. Chest x-ray was read as negative.  Repeat electrocardiogram is unchanged. Case discussed with cardiology. It was the opinion that this is pericarditis. The patient was initially treated with narcotic pain medication. It was the suggestion of cardiology that the patient be treated with colchicine.  Prescription for Robaxin, colchicine and Ultram given to the patient. The patient is to follow-up with primary physician or return to the emergency department if not improving.    Final diagnoses:  Pericarditis, unspecified chronicity, unspecified type    New Prescriptions Discharge Medication List as of 05/02/2017 12:56 PM    START taking these medications   Details  colchicine 0.6 MG tablet Take 1 tablet (0.6 mg total) by mouth daily. Take with food, Starting Tue 05/02/2017, Print    methocarbamol (ROBAXIN) 500 MG tablet Take 1 tablet (500 mg total) by mouth 3 (three) times daily. 1 po tid prn for spasm chest wall pain., Starting Tue 05/02/2017, Print    traMADol (ULTRAM) 50 MG tablet 1 or  2 po q6h prn pain, Print         Ivery Quale, PA-C 05/03/17 1702    Bethann Berkshire, MD 05/05/17 [phone]

## 2017-05-02 NOTE — ED Triage Notes (Signed)
Chest pain and sob since last night

## 2017-05-05 NOTE — ED Notes (Signed)
Pt called requesting work note be changed to be able to return to work today.   Notified H. Beverely Pace PA and he is reviewing pt's chart.

## 2017-05-10 ENCOUNTER — Emergency Department (HOSPITAL_COMMUNITY)
Admission: EM | Admit: 2017-05-10 | Discharge: 2017-05-10 | Disposition: A | Discharge status: Home / Self Care | Payer: BLUE CROSS/BLUE SHIELD | Attending: Emergency Medicine | Admitting: Emergency Medicine

## 2017-05-10 ENCOUNTER — Emergency Department (HOSPITAL_COMMUNITY): Payer: BLUE CROSS/BLUE SHIELD

## 2017-05-10 ENCOUNTER — Encounter (HOSPITAL_COMMUNITY): Payer: Self-pay

## 2017-05-10 DIAGNOSIS — J45909 Unspecified asthma, uncomplicated: Secondary | ICD-10-CM | POA: Insufficient documentation

## 2017-05-10 DIAGNOSIS — R112 Nausea with vomiting, unspecified: Secondary | ICD-10-CM

## 2017-05-10 DIAGNOSIS — Z87891 Personal history of nicotine dependence: Secondary | ICD-10-CM | POA: Diagnosis not present

## 2017-05-10 DIAGNOSIS — R200 Anesthesia of skin: Secondary | ICD-10-CM | POA: Diagnosis not present

## 2017-05-10 DIAGNOSIS — R072 Precordial pain: Secondary | ICD-10-CM | POA: Diagnosis present

## 2017-05-10 HISTORY — DX: Disease of pericardium, unspecified: I31.9

## 2017-05-10 LAB — CBC WITH DIFFERENTIAL/PLATELET
BASOS PCT: 0 %
Basophils Absolute: 0 10*3/uL (ref 0.0–0.1)
EOS ABS: 0.1 10*3/uL (ref 0.0–0.7)
Eosinophils Relative: 2 %
HEMATOCRIT: 38.3 % — AB (ref 39.0–52.0)
Hemoglobin: 13 g/dL (ref 13.0–17.0)
LYMPHS ABS: 1.5 10*3/uL (ref 0.7–4.0)
Lymphocytes Relative: 39 %
MCH: 30.7 pg (ref 26.0–34.0)
MCHC: 33.9 g/dL (ref 30.0–36.0)
MCV: 90.3 fL (ref 78.0–100.0)
MONO ABS: 0.5 10*3/uL (ref 0.1–1.0)
MONOS PCT: 12 %
Neutro Abs: 1.8 10*3/uL (ref 1.7–7.7)
Neutrophils Relative %: 47 %
Platelets: 222 10*3/uL (ref 150–400)
RBC: 4.24 MIL/uL (ref 4.22–5.81)
RDW: 12.2 % (ref 11.5–15.5)
WBC: 3.9 10*3/uL — ABNORMAL LOW (ref 4.0–10.5)

## 2017-05-10 LAB — COMPREHENSIVE METABOLIC PANEL
ALBUMIN: 3.8 g/dL (ref 3.5–5.0)
ALT: 67 U/L — AB (ref 17–63)
AST: 38 U/L (ref 15–41)
Alkaline Phosphatase: 82 U/L (ref 38–126)
Anion gap: 6 (ref 5–15)
BUN: 17 mg/dL (ref 6–20)
CALCIUM: 8.9 mg/dL (ref 8.9–10.3)
CHLORIDE: 110 mmol/L (ref 101–111)
CO2: 26 mmol/L (ref 22–32)
CREATININE: 1.33 mg/dL — AB (ref 0.61–1.24)
GFR calc Af Amer: >60 mL/min (ref >60)
GFR calc non Af Amer: >60 mL/min (ref >60)
GLUCOSE: 89 mg/dL (ref 65–99)
Potassium: 3.9 mmol/L (ref 3.5–5.1)
SODIUM: 142 mmol/L (ref 135–145)
Total Bilirubin: 0.7 mg/dL (ref 0.3–1.2)
Total Protein: 7.7 g/dL (ref 6.5–8.1)

## 2017-05-10 LAB — I-STAT TROPONIN, ED: TROPONIN I, POC: 0 ng/mL (ref 0.00–0.08)

## 2017-05-10 LAB — D-DIMER, QUANTITATIVE (NOT AT ARMC): D DIMER QUANT: 0.29 ug{FEU}/mL (ref 0.00–0.50)

## 2017-05-10 MED ORDER — SUCRALFATE 1 GM/10ML PO SUSP: 1.0000 g | Freq: Three times a day (TID) | ORAL | 0 refills | Status: DC

## 2017-05-10 MED ORDER — TRAMADOL HCL 50 MG PO TABS: 50.0000 mg | ORAL_TABLET | Freq: Four times a day (QID) | ORAL | 0 refills | Status: DC | PRN

## 2017-05-10 MED ORDER — FAMOTIDINE 40 MG PO TABS: 40.0000 mg | ORAL_TABLET | Freq: Every day | ORAL | 0 refills | Status: DC

## 2017-05-10 MED ORDER — IBUPROFEN 800 MG PO TABS: 800.0000 mg | ORAL_TABLET | Freq: Three times a day (TID) | ORAL | 0 refills | Status: DC | PRN

## 2017-05-10 MED ORDER — SODIUM CHLORIDE 0.9 % IV BOLUS (SEPSIS)
500.0000 mL | Freq: Once | INTRAVENOUS | Status: AC
  Administered 2017-05-10: 500 mL via INTRAVENOUS

## 2017-05-10 MED ORDER — ONDANSETRON HCL 4 MG/2ML IJ SOLN
4.0000 mg | Freq: Once | INTRAMUSCULAR | Status: AC
  Administered 2017-05-10: 4 mg via INTRAVENOUS
  Filled 2017-05-10: qty 2

## 2017-05-10 MED ORDER — GI COCKTAIL ~~LOC~~
30.0000 mL | Freq: Once | ORAL | Status: AC
  Administered 2017-05-10: 30 mL via ORAL
  Filled 2017-05-10: qty 30

## 2017-05-10 NOTE — ED Triage Notes (Signed)
Pt here recently and was diagnosed with pericarditis. Went back to work Monday and today started vomiting blood and has numbness in left arm.  Reports arm numbness has been present for 2 days.  Pt says emesis looked like coffee grounds.

## 2017-05-10 NOTE — ED Provider Notes (Signed)
Emergency Department Provider Note   I have reviewed the triage vital signs and the nursing notes.   HISTORY  Chief Complaint Chest Pain   HPI Tyrone Baker is a 41 y.o. male with PMH of asthma and recent diagnosis of pericarditis presents to the emergency department for evaluation of ontinued aching chest pain with new onset vomiting. The patient was diagnosed with pericarditis on 05/02/17. He was started on pain medication. He has not been taking NSAIDs or steroids. Pain seemed to improve slightly but returned today. He currently describes it as 8/10 in severity and intermittent throbbing quality. No sharp stabbing or ripping pain. No radiation. This morning he was feeling nauseated and proceeded to have multiple episodes of vomiting at work. During some of his vomiting he noticed significant amounts of what he presumed was blood. He describes it as brown, the color of coffee before you brew. No black coffee ground emesis. No BRB. He noted some BRBPR several days ago but this resolved. No fever or chills. No sick contacts. CP not significantly or suddenly worse or different.   He also notes that two days ago he developed left forearm numbness while mowing the grass. He describes a numb area of skin from the base of the thumb to the lateral elbow. Normal strength. Normal sensation in the fingers and remainder of the arm. No shoulder or neck pain. No modifying factors.    Past Medical History:  Diagnosis Date  . Asthma   . Pericarditis     There are no active problems to display for this patient.   History reviewed. No pertinent surgical history.  Current Outpatient Rx  . Order #: [id number] Class: Print  . Order #: [id number] Class: Print  . Order #: [id number] Class: Print  . Order #: [id number] Class: Print  . Order #: [id number] Class: Print  . Order #: [id number] Class: Print    Allergies Patient has no known allergies.  No family history on file.  Social History Social  History  Substance Use Topics  . Smoking status: Former Games developer  . Smokeless tobacco: Never Used  . Alcohol use No    Review of Systems  Constitutional: No fever/chills Eyes: No visual changes. ENT: No sore throat. Cardiovascular: Positive chest pain. Respiratory: Denies shortness of breath. Gastrointestinal: No abdominal pain. Positive nausea and vomiting.  No diarrhea.  No constipation. Genitourinary: Negative for dysuria. Musculoskeletal: Negative for back pain. Skin: Negative for rash. Neurological: Negative for headaches, focal weakness. Positive left forearm numbness.   10-point ROS otherwise negative.  ____________________________________________   PHYSICAL EXAM:  VITAL SIGNS: ED Triage Vitals  Enc Vitals Group     BP 05/10/17 1623 119/86     Pulse Rate 05/10/17 1623 86     Resp 05/10/17 1623 19     Temp 05/10/17 1623 98.4 F (36.9 C)     Temp Source 05/10/17 1623 Oral     SpO2 05/10/17 1623 98 %     Weight 05/10/17 1623 201 lb (91.2 kg)     Height 05/10/17 1623 5\' 11"  (1.803 m)     Pain Score 05/10/17 1625 8   Constitutional: Alert and oriented. Well appearing and in no acute distress. Eyes: Conjunctivae are normal.  Head: Atraumatic. Nose: No congestion/rhinnorhea. Mouth/Throat: Mucous membranes are moist. Neck: No stridor.  Cardiovascular: Normal rate, regular rhythm. Good peripheral circulation. Grossly normal heart sounds.   Respiratory: Normal respiratory effort.  No retractions. Lungs CTAB. Gastrointestinal: Soft and nontender. No distention.  Musculoskeletal: No lower extremity tenderness  nor edema. No gross deformities of extremities. Neurologic:  Normal speech and language. No gross focal neurologic deficits are appreciated.  Skin:  Skin is warm, dry and intact. No rash noted.  ____________________________________________   LABS (all labs ordered are listed, but only abnormal results are displayed)  Labs Reviewed  COMPREHENSIVE METABOLIC  PANEL - Abnormal; Notable for the following:       Result Value   Creatinine, Ser 1.33 (*)    ALT 67 (*)    All other components within normal limits  CBC WITH DIFFERENTIAL/PLATELET - Abnormal; Notable for the following:    WBC 3.9 (*)    HCT 38.3 (*)    All other components within normal limits  D-DIMER, QUANTITATIVE (NOT AT [facility])  I-STAT TROPONIN, ED   ____________________________________________  EKG   EKG Interpretation  Date/Time:  Wednesday May 10 2017 16:31:21 EDT Ventricular Rate:  87 PR Interval:    QRS Duration: 87 QT Interval:  337 QTC Calculation: 406 R Axis:   82 Text Interpretation:  Sinus rhythm ST elev, probable normal early repol pattern No STEMI.  Confirmed by Alona Bene [phone]) on 05/10/2017 5:19:29 PM       ____________________________________________  RADIOLOGY  Dg Chest 2 View  Result Date: 05/10/2017 CLINICAL DATA:  Initial evaluation for productive cough for 2 weeks. Left-sided chest pain. EXAM: CHEST  2 VIEW COMPARISON:  Prior radiograph from 05/02/2017. FINDINGS: The cardiac and mediastinal silhouettes are stable in size and contour, and remain within normal limits. The lungs are normally inflated. No airspace consolidation, pleural effusion, or pulmonary edema is identified. There is no pneumothorax. No acute osseous abnormality identified. IMPRESSION: No radiographic evidence for active cardiopulmonary disease. Electronically Signed   By: Rise Mu M.D.   On: 05/10/2017 17:24    ____________________________________________   PROCEDURES  Procedure(s) performed:   Procedures  None ____________________________________________   INITIAL IMPRESSION / ASSESSMENT AND PLAN / ED COURSE  Pertinent labs & imaging results that were available during my care of the patient were reviewed by me and considered in my medical decision making (see chart for details).  Patient presents to the emergency department with continued, throbbing  chest discomfort that is reproducible on palpation of the chest wall. No sudden worsening symptoms. The patient's left arm numbness is isolated to the lateral forearm and tracking to the base of the left thumb. This seems peripheral in nature. Equal pulses bilaterally. No evidence to suggest aortic dissection. Very low suspicion for pulmonary embolism. With multiple episodes of vomiting patient may have experienced a Mallory-Weiss tear. No evidence to suggest Boerhaave syndrome. No "coffee ground" black emesis. Emesis was reported to be brown in color. No BRB reported.  No vomiting in the ED. Reports feeling much better after IVF, Zofran, and GI cocktail. Tolerating PO. Plan for discharge with NSAIDs and other symptomatic treatment for nausea/vomiting.   At this time, I do not feel there is any life-threatening condition present. I have reviewed and discussed all results (EKG, imaging, lab, urine as appropriate), exam findings with patient. I have reviewed nursing notes and appropriate previous records.  I feel the patient is safe to be discharged home without further emergent workup. Discussed usual and customary return precautions. Patient and family (if present) verbalize understanding and are comfortable with this plan.  Patient will follow-up with their primary care provider. If they do not have a primary care provider, information for follow-up has been provided to them. All questions have been answered.  ____________________________________________  FINAL  CLINICAL IMPRESSION(S) / ED DIAGNOSES  Final diagnoses:  Precordial chest pain  Non-intractable vomiting with nausea, unspecified vomiting type     MEDICATIONS GIVEN DURING THIS VISIT:  Medications  sodium chloride 0.9 % bolus 500 mL (0 mLs Intravenous Stopped 05/10/17 1858)  ondansetron (ZOFRAN) injection 4 mg (4 mg Intravenous Given 05/10/17 1741)  gi cocktail (Maalox,Lidocaine,Donnatal) (30 mLs Oral Given 05/10/17 1834)     NEW  OUTPATIENT MEDICATIONS STARTED DURING THIS VISIT:  Discharge Medication List as of 05/10/2017  7:12 PM    START taking these medications   Details  famotidine (PEPCID) 40 MG tablet Take 1 tablet (40 mg total) by mouth daily., Starting Wed 05/10/2017, Print    ibuprofen (ADVIL,MOTRIN) 800 MG tablet Take 1 tablet (800 mg total) by mouth every 8 (eight) hours as needed., Starting Wed 05/10/2017, Print    sucralfate (CARAFATE) 1 GM/10ML suspension Take 10 mLs (1 g total) by mouth 4 (four) times daily -  with meals and at bedtime., Starting Wed 05/10/2017, Print         Note:  This document was prepared using Dragon voice recognition software and may include unintentional dictation errors.  Alona BeneJoshua Krayton Wortley, MD Emergency Medicine    Leticia Mcdiarmid, Arlyss RepressJoshua G, MD 05/11/17 1106

## 2017-05-10 NOTE — Discharge Instructions (Signed)

## 2017-05-17 ENCOUNTER — Ambulatory Visit (INDEPENDENT_AMBULATORY_CARE_PROVIDER_SITE_OTHER): Payer: BLUE CROSS/BLUE SHIELD | Admitting: Family Medicine

## 2017-05-17 ENCOUNTER — Encounter: Payer: Self-pay | Admitting: Family Medicine

## 2017-05-17 VITALS — BP 112/78 | HR 84 | Temp 98.1°F | Resp 18 | Ht 71.0 in | Wt 189.1 lb

## 2017-05-17 DIAGNOSIS — J4520 Mild intermittent asthma, uncomplicated: Secondary | ICD-10-CM

## 2017-05-17 DIAGNOSIS — Z131 Encounter for screening for diabetes mellitus: Secondary | ICD-10-CM | POA: Diagnosis not present

## 2017-05-17 DIAGNOSIS — B3323 Viral pericarditis: Secondary | ICD-10-CM

## 2017-05-17 DIAGNOSIS — D729 Disorder of white blood cells, unspecified: Secondary | ICD-10-CM

## 2017-05-17 DIAGNOSIS — R7989 Other specified abnormal findings of blood chemistry: Secondary | ICD-10-CM | POA: Diagnosis not present

## 2017-05-17 DIAGNOSIS — R030 Elevated blood-pressure reading, without diagnosis of hypertension: Secondary | ICD-10-CM

## 2017-05-17 DIAGNOSIS — Z113 Encounter for screening for infections with a predominantly sexual mode of transmission: Secondary | ICD-10-CM | POA: Diagnosis not present

## 2017-05-17 DIAGNOSIS — R945 Abnormal results of liver function studies: Secondary | ICD-10-CM

## 2017-05-17 DIAGNOSIS — J45909 Unspecified asthma, uncomplicated: Secondary | ICD-10-CM | POA: Insufficient documentation

## 2017-05-17 MED ORDER — ALBUTEROL SULFATE HFA 108 (90 BASE) MCG/ACT IN AERS: 1.0000 | INHALATION_SPRAY | Freq: Four times a day (QID) | RESPIRATORY_TRACT | 0 refills | Status: DC | PRN

## 2017-05-17 NOTE — Patient Instructions (Signed)
Albuterol inhalation aerosol What is this medicine? ALBUTEROL (al BYOO ter ole) is a bronchodilator. It helps open up the airways in your lungs to make it easier to breathe. This medicine is used to treat and to prevent bronchospasm. This medicine may be used for other purposes; ask your health care provider or pharmacist if you have questions. COMMON BRAND NAME(S): Proair HFA, Proventil, Proventil HFA, Respirol, Ventolin, Ventolin HFA What should I tell my health care provider before I take this medicine? They need to know if you have any of the following conditions: -diabetes -heart disease or irregular heartbeat -high blood pressure -pheochromocytoma -seizures -thyroid disease -an unusual or allergic reaction to albuterol, levalbuterol, sulfites, other medicines, foods, dyes, or preservatives -pregnant or trying to get pregnant -breast-feeding How should I use this medicine? This medicine is for inhalation through the mouth. Follow the directions on your prescription label. Take your medicine at regular intervals. Do not use more often than directed. Make sure that you are using your inhaler correctly. Ask you doctor or health care provider if you have any questions. Talk to your pediatrician regarding the use of this medicine in children. Special care may be needed. Overdosage: If you think you have taken too much of this medicine contact a poison control center or emergency room at once. NOTE: This medicine is only for you. Do not share this medicine with others. What if I miss a dose? If you miss a dose, use it as soon as you can. If it is almost time for your next dose, use only that dose. Do not use double or extra doses. What may interact with this medicine? -anti-infectives like chloroquine and pentamidine -caffeine -cisapride -diuretics -medicines for colds -medicines for depression or for emotional or psychotic conditions -medicines for weight loss including some herbal  products -methadone -some antibiotics like clarithromycin, erythromycin, levofloxacin, and linezolid -some heart medicines -steroid hormones like dexamethasone, cortisone, hydrocortisone -theophylline -thyroid hormones This list may not describe all possible interactions. Give your health care provider a list of all the medicines, herbs, non-prescription drugs, or dietary supplements you use. Also tell them if you smoke, drink alcohol, or use illegal drugs. Some items may interact with your medicine. What should I watch for while using this medicine? Tell your doctor or health care professional if your symptoms do not improve. Do not use extra albuterol. If your asthma or bronchitis gets worse while you are using this medicine, call your doctor right away. If your mouth gets dry try chewing sugarless gum or sucking hard candy. Drink water as directed. What side effects may I notice from receiving this medicine? Side effects that you should report to your doctor or health care professional as soon as possible: -allergic reactions like skin rash, itching or hives, swelling of the face, lips, or tongue -breathing problems -chest pain -feeling faint or lightheaded, falls -high blood pressure -irregular heartbeat -fever -muscle cramps or weakness -pain, tingling, numbness in the hands or feet -vomiting Side effects that usually do not require medical attention (report to your doctor or health care professional if they continue or are bothersome): -cough -difficulty sleeping -headache -nervousness or trembling -stomach upset -stuffy or runny nose -throat irritation -unusual taste This list may not describe all possible side effects. Call your doctor for medical advice about side effects. You may report side effects to FDA at 1-800-FDA-1088. Where should I keep my medicine? Keep out of the reach of children. Store at room temperature between 15 and 30 degrees   C (59 and 86 degrees F). The  contents are under pressure and may burst when exposed to heat or flame. Do not freeze. This medicine does not work as well if it is too cold. Throw away any unused medicine after the expiration date. Inhalers need to be thrown away after the labeled number of puffs have been used or by the expiration date; whichever comes first. Ventolin HFA should be thrown away 12 months after removing from foil pouch. Check the instructions that come with your medicine. NOTE: This sheet is a summary. It may not cover all possible information. If you have questions about this medicine, talk to your doctor, pharmacist, or health care provider.  2018 [employer]vier/Gold Standard (2013-02-14 10:57:17)  

## 2017-05-17 NOTE — Progress Notes (Signed)
Patient ID: Tyrone Baker, male    DOB: Feb 06, 1976, 41 y.o.   MRN: [id number]  Chief Complaint  Patient presents with  . Follow-up    est    Allergies Patient has no known allergies.  Subjective:   Tyrone Baker is a 41 y.o. male who presents [facility] today.  HPI Here to establish care. Was recently seen in the ED for pericarditis. Reports that is in no pain now. Reports that has been doing well. Does not have any CP. Breathing well. Here to follow up b/c the ED told him that he needed to come and establish care with a PCP. Reports that has been healthy. Has a history of asthma for years, since was a child, but does not have problems. Reports that occasionally will use his inhaler, less than every three months, if around smoke. Would like a refill to have for emergencies. Reports that has been screened for HIV in the past. Denies any drug use. Reports that when had the pericarditis, that he had symptoms of a URI. Feels back to normal now. Exercises and does yard work. Mood is good. Sleeps well. Does not have any complaints. Reports that when went to ED had elevated BP and wanted to get that checked. Says he wants to stay healthy. Reports that he did not take the medications from the ED b/c they made him feel bad. Does not like to take medications.     Past Medical History:  Diagnosis Date  . Asthma   . Pericarditis     History reviewed. No pertinent surgical history.  Family History  Problem Relation Age of Onset  . Hypertension Mother   . Diabetes Father      Social History   Social History  . Marital status: Single    Spouse name: N/A  . Number of children: N/A  . Years of education: N/A   Social History Main Topics  . Smoking status: Former Smoker    Packs/day: 1.00    Years: 1.00  . Smokeless tobacco: Never Used  . Alcohol use No  . Drug use: No  . Sexual activity: Yes    Birth control/ protection: None   Other Topics Concern  . None    Social History Narrative   Works at [employer]. Divorced. Dating. Has a son, who is 40 years old.     Outpatient Medications Prior to Visit  Medication Sig Dispense Refill  . colchicine 0.6 MG tablet Take 1 tablet (0.6 mg total) by mouth daily. Take with food 6 tablet 0  . famotidine (PEPCID) 40 MG tablet Take 1 tablet (40 mg total) by mouth daily. 30 tablet 0  . ibuprofen (ADVIL,MOTRIN) 800 MG tablet Take 1 tablet (800 mg total) by mouth every 8 (eight) hours as needed. 21 tablet 0  . methocarbamol (ROBAXIN) 500 MG tablet Take 1 tablet (500 mg total) by mouth 3 (three) times daily. 1 po tid prn for spasm chest wall pain. 21 tablet 0  . sucralfate (CARAFATE) 1 GM/10ML suspension Take 10 mLs (1 g total) by mouth 4 (four) times daily -  with meals and at bedtime. 420 mL 0  . traMADol (ULTRAM) 50 MG tablet Take 1 tablet (50 mg total) by mouth every 6 (six) hours as needed. 15 tablet 0   No facility-administered medications prior to visit.     Review of Systems  Constitutional: Negative for activity change, appetite change, chills, diaphoresis, fatigue, fever and unexpected  weight change.  Respiratory: Negative for apnea, cough, shortness of breath, wheezing and stridor.   Cardiovascular: Negative for chest pain, palpitations and leg swelling.  Gastrointestinal: Negative for abdominal pain, blood in stool, constipation and diarrhea.  Genitourinary: Negative for dysuria.  Musculoskeletal: Negative for arthralgias.  Neurological: Negative for dizziness, tremors, syncope, speech difficulty, numbness and headaches.  Hematological: Negative for adenopathy. Does not bruise/bleed easily.  Psychiatric/Behavioral: Negative for agitation, dysphoric mood and sleep disturbance.     Objective:   BP 112/78 (BP Location: Right Arm, Patient Position: Sitting, Cuff Size: Normal)   Pulse 84   Temp 98.1 F (36.7 C) (Temporal)   Resp 18   Ht 5\' 11"  (1.803 m)   Wt 189 lb 1.9 oz (85.8 kg)   SpO2  98%   BMI 26.38 kg/m   Physical Exam  Constitutional: He is oriented to person, place, and time. He appears well-developed and well-nourished. No distress.  HENT:  Head: Normocephalic and atraumatic.  Nose: Nose normal.  Mouth/Throat: Oropharynx is clear and moist. No oropharyngeal exudate.  Eyes: Pupils are equal, round, and reactive to light. Conjunctivae and EOM are normal. No scleral icterus.  Neck: Normal range of motion. Neck supple. No JVD present. No tracheal deviation present. No thyromegaly present.  Cardiovascular: Normal rate, regular rhythm and normal heart sounds.  Exam reveals no friction rub.   No murmur heard. Pulmonary/Chest: Effort normal and breath sounds normal. No respiratory distress. He has no wheezes. He has no rales. He exhibits no tenderness.  Abdominal: Soft. Bowel sounds are normal. There is no hepatosplenomegaly. There is no tenderness.  Musculoskeletal: Normal range of motion.  Lymphadenopathy:    He has no cervical adenopathy.  Neurological: He is alert and oriented to person, place, and time. No cranial nerve deficit.  Skin: Skin is warm and dry. No rash noted. He is not diaphoretic. No erythema.  Psychiatric: He has a normal mood and affect. His speech is normal and behavior is normal. Thought content normal. His mood appears not anxious. He does not exhibit a depressed mood.      Assessment and Plan   1. Elevated liver function tests Review of lab tests from the ED revealed an elevated liver test. Suspect secondary to viral syndrome that caused pericarditis. However, repeat liver tests and check for viral hepatitis.  - Hepatitis panel, acute -Check LFTs  2. Elevated serum creatinine Suspect secondary to dehydration.  - Basic metabolic panel  3. Screen for STD (sexually transmitted disease) Patient defers testing for GC/Chl and RPR. Would like testing for HIV.  - HIV antibody (with reflex)  4. Screening for diabetes mellitus Reports family  history and never screened. Congratulated on diet and lifestyle changes. Patient will continue healthy eating and weight loss.  - Hemoglobin A1c  5. Elevated BP without diagnosis of hypertension BP normal today. Will check at follow up. Suspect elevated at ED due to pain, pericarditis, etc.  - Lipid panel  6. Abnormal WBC count Suspect secondary to viral etiology. Recheck.  - CBC with Differential/Platelet  7. Mild intermittent asthma without complication Counseled regarding worrisome s/s of asthma. Refilled MDI for emergency use. Defers flu shot. Understands that if has breathing problems or need to use MDI more frequently to follow up.  Patient counseled in detail regarding the risks of medication. Told to call or return to clinic if develop any worrisome signs or symptoms. Patient voiced understanding.     ED records reviewed. Discussed and explained to patient. Voiced understanding.  Counseling given. Agrees to follow up for CPE.   Return in about 3 months (around 08/16/2017) for CPE.  Aliene Beams, MD 05/17/2017

## 2017-05-17 NOTE — Progress Notes (Deleted)
    Patient ID: Tyrone Baker, male    DOB: Oct 05, 1975, 41 y.o.   MRN: [id number]  Chief Complaint  Patient presents with  . Follow-up    est    Allergies Patient has no known allergies.  Subjective:   Tyrone Lunchric G Abbasi is a 41 y.o. male who presents to [facility] today.  HPI HPI  Past Medical History:  Diagnosis Date  . Asthma   . Pericarditis     History reviewed. No pertinent surgical history.  Family History  Problem Relation Age of Onset  . Hypertension Mother   . Diabetes Father      Social History   Social History  . Marital status: Single    Spouse name: N/A  . Number of children: N/A  . Years of education: N/A   Social History Main Topics  . Smoking status: Former Smoker    Packs/day: 1.00    Years: 1.00  . Smokeless tobacco: Never Used  . Alcohol use No  . Drug use: No  . Sexual activity: Yes    Birth control/ protection: None   Other Topics Concern  . None   Social History Narrative   Works at [employer]. Divorced. Dating. Has a son, who is 40 years old.     Outpatient Medications Prior to Visit  Medication Sig Dispense Refill  . colchicine 0.6 MG tablet Take 1 tablet (0.6 mg total) by mouth daily. Take with food 6 tablet 0  . famotidine (PEPCID) 40 MG tablet Take 1 tablet (40 mg total) by mouth daily. 30 tablet 0  . ibuprofen (ADVIL,MOTRIN) 800 MG tablet Take 1 tablet (800 mg total) by mouth every 8 (eight) hours as needed. 21 tablet 0  . methocarbamol (ROBAXIN) 500 MG tablet Take 1 tablet (500 mg total) by mouth 3 (three) times daily. 1 po tid prn for spasm chest wall pain. 21 tablet 0  . sucralfate (CARAFATE) 1 GM/10ML suspension Take 10 mLs (1 g total) by mouth 4 (four) times daily -  with meals and at bedtime. 420 mL 0  . traMADol (ULTRAM) 50 MG tablet Take 1 tablet (50 mg total) by mouth every 6 (six) hours as needed. 15 tablet 0   No facility-administered medications prior to visit.     Review of  Systems   Objective:   BP 112/78 (BP Location: Right Arm, Patient Position: Sitting, Cuff Size: Normal)   Pulse 84   Temp 98.1 F (36.7 C) (Temporal)   Resp 18   Ht 5\' 11"  (1.803 m)   Wt 189 lb 1.9 oz (85.8 kg)   SpO2 98%   BMI 26.38 kg/m   Physical Exam   IAssessment and Plan   1. Elevated liver function tests *** - Hepatitis panel, acute  2. Elevated serum creatinine *** - Basic metabolic panel  3. Screen for STD (sexually transmitted disease) *** - HIV antibody (with reflex)  4. Screening for diabetes mellitus *** - Hemoglobin A1c  5. Elevated BP without diagnosis of hypertension *** - Lipid panel  6. Abnormal WBC count *** - CBC with Differential/Platelet  7. Mild intermittent asthma without complication ***        Crawford GivensSelena M Putman, LPN 1/1/91479/01/2017

## 2017-05-18 LAB — CBC WITH DIFFERENTIAL/PLATELET
BASOS PCT: 0.7 %
Basophils Absolute: 30 cells/uL (ref 0–200)
EOS ABS: 90 {cells}/uL (ref 15–500)
Eosinophils Relative: 2.1 %
HEMATOCRIT: 41.7 % (ref 38.5–50.0)
HEMOGLOBIN: 13.8 g/dL (ref 13.2–17.1)
LYMPHS ABS: 1488 {cells}/uL (ref 850–3900)
MCH: 29.5 pg (ref 27.0–33.0)
MCHC: 33.1 g/dL (ref 32.0–36.0)
MCV: 89.1 fL (ref 80.0–100.0)
MPV: 9.8 fL (ref 7.5–12.5)
Monocytes Relative: 11.1 %
NEUTROS ABS: 2215 {cells}/uL (ref 1500–7800)
Neutrophils Relative %: 51.5 %
PLATELETS: 275 10*3/uL (ref 140–400)
RBC: 4.68 10*6/uL (ref 4.20–5.80)
RDW: 12.4 % (ref 11.0–15.0)
Total Lymphocyte: 34.6 %
WBC: 4.3 10*3/uL (ref 3.8–10.8)
WBCMIX: 477 {cells}/uL (ref 200–950)

## 2017-05-18 LAB — HEPATITIS PANEL, ACUTE
HEP A IGM: NONREACTIVE
HEP B S AG: NONREACTIVE
Hep B C IgM: NONREACTIVE
Hepatitis C Ab: NONREACTIVE
SIGNAL TO CUT-OFF: 0.02 (ref <1.00)

## 2017-05-18 LAB — HEPATIC FUNCTION PANEL
AG RATIO: 1.2 (calc) (ref 1.0–2.5)
ALBUMIN MSPROF: 4 g/dL (ref 3.6–5.1)
ALT: 22 U/L (ref 9–46)
AST: 19 U/L (ref 10–40)
Alkaline phosphatase (APISO): 71 U/L (ref 40–115)
BILIRUBIN INDIRECT: 0.4 mg/dL (ref 0.2–1.2)
Bilirubin, Direct: 0.1 mg/dL (ref 0.0–0.2)
GLOBULIN: 3.3 g/dL (ref 1.9–3.7)
TOTAL PROTEIN: 7.3 g/dL (ref 6.1–8.1)
Total Bilirubin: 0.5 mg/dL (ref 0.2–1.2)

## 2017-05-18 LAB — LIPID PANEL
Cholesterol: 159 mg/dL (ref <200)
HDL: 32 mg/dL — ABNORMAL LOW (ref >40)
LDL Cholesterol (Calc): 102 mg/dL (calc) — ABNORMAL HIGH
NON-HDL CHOLESTEROL (CALC): 127 mg/dL (ref <130)
TRIGLYCERIDES: 153 mg/dL — AB (ref <150)
Total CHOL/HDL Ratio: 5 (calc) — ABNORMAL HIGH (ref <5.0)

## 2017-05-18 LAB — HIV ANTIBODY (ROUTINE TESTING W REFLEX): HIV: NONREACTIVE

## 2017-05-18 LAB — HEMOGLOBIN A1C
EAG (MMOL/L): 6.2 (calc)
HEMOGLOBIN A1C: 5.5 %{Hb} (ref <5.7)
MEAN PLASMA GLUCOSE: 111 (calc)

## 2017-10-10 ENCOUNTER — Ambulatory Visit (INDEPENDENT_AMBULATORY_CARE_PROVIDER_SITE_OTHER): Payer: BLUE CROSS/BLUE SHIELD | Admitting: Family Medicine

## 2017-10-10 ENCOUNTER — Other Ambulatory Visit: Payer: Self-pay

## 2017-10-10 ENCOUNTER — Encounter: Payer: Self-pay | Admitting: Family Medicine

## 2017-10-10 VITALS — BP 120/80 | HR 87 | Temp 98.2°F | Resp 16 | Ht 71.0 in | Wt 202.8 lb

## 2017-10-10 DIAGNOSIS — K625 Hemorrhage of anus and rectum: Secondary | ICD-10-CM | POA: Diagnosis not present

## 2017-10-10 LAB — POC HEMOCCULT BLD/STL (OFFICE/1-CARD/DIAGNOSTIC): OCCULT BLOOD DATE: POSITIVE

## 2017-10-10 NOTE — Progress Notes (Signed)
Patient ID: Tyrone Baker, male    DOB: [birth year], 42 y.o.   MRN: [id number]  Chief Complaint  Patient presents with  . Blood In Stools    Allergies Patient has no known allergies.  Subjective:   Tyrone Baker is a 42 y.o. male who [facility].  HPI Mr. Tyrone BaneHughes presents today due to rectal bleeding.  He reports that for the past 2 weeks every time he is gone to the bowel movement he is seen blood when he wipes, on the toilet, and on the stool.  He denies any change in his stool caliber.  He reports that his bowel movements are not hard.  He denies constipation.  He reports that he has had some lower abdominal cramping and may be some mild burning after a bowel movement.  He reports that his bowel movements occur qd.  No family history of colon cancer or inflammatory bowel disease.  He denies any history of hemorrhoids.  He denies any epigastric pain, belching, dyspepsia.  He does not have any chronic daily use of NSAIDs or alcohol.  He does not smoke. No black stools. No weight loss. Energy good. No syncope.  Denies any chest pain, shortness of breath, or palpitations.  He reports that nothing seems to make this better or worse.  He wonders if the symptoms are due to a food that he is eating.  However, he denies any change in his usual diet pattern.  He denies any dysuria.  He reports that the cramping is sporadic and can or cannot be associated with bowel movements.  He reports he has been able to carry on with his usual activity and work.  He denies any current pain.   Rectal Bleeding   The current episode started more than 2 weeks ago. The onset was sudden. The problem occurs frequently. The problem has been unchanged. The pain is mild. The stool is described as soft. Treatments tried: Has never had this problem before. Pertinent negatives include no anorexia, no fever, no diarrhea, no hematemesis, no hemorrhoids, no nausea, no rectal pain, no vomiting, no  hematuria, no chest pain, no headaches, no difficulty breathing and no rash. He has been eating and drinking normally. Urine output has been normal. His past medical history does not include recent abdominal injury, recent antibiotic use, recent change in diet or a recent illness.    Past Medical History:  Diagnosis Date  . Asthma   . Pericarditis     No past surgical history on file.  Family History  Problem Relation Age of Onset  . Hypertension Mother   . Diabetes Father      Social History   Socioeconomic History  . Marital status: Single    Spouse name: Not on file  . Number of children: Not on file  . Years of education: Not on file  . Highest education level: Not on file  Social Needs  . Financial resource strain: Not on file  . Food insecurity - worry: Not on file  . Food insecurity - inability: Not on file  . Transportation needs - medical: Not on file  . Transportation needs - non-medical: Not on file  Occupational History  . Not on file  Tobacco Use  . Smoking status: Former Smoker    Packs/day: 1.00    Years: 1.00    Pack years: 1.00  . Smokeless tobacco: Never Used  Substance and Sexual Activity  . Alcohol use:  No  . Drug use: No  . Sexual activity: Yes    Birth control/protection: None  Other Topics Concern  . Not on file  Social History Narrative   Works at [employer]. Divorced. Dating. Has a son, who is 78 years old.     Review of Systems  Constitutional: Negative for activity change, appetite change, fatigue and fever.  HENT: Negative for nosebleeds.   Eyes: Negative for visual disturbance.  Respiratory: Negative for chest tightness and shortness of breath.   Cardiovascular: Negative for chest pain and palpitations.  Gastrointestinal: Positive for anal bleeding, blood in stool and hematochezia. Negative for abdominal distention, anorexia, constipation, diarrhea, hematemesis, hemorrhoids, nausea, rectal pain and vomiting.  Genitourinary:  Negative for dysuria and hematuria.  Skin: Negative for rash.  Neurological: Negative for dizziness, syncope, light-headedness and headaches.     Objective:   BP 120/80 (BP Location: Left Arm, Patient Position: Sitting, Cuff Size: Normal)   Pulse 87   Temp 98.2 F (36.8 C) (Temporal)   Resp 16   Ht 5\' 11"  (1.803 m)   Wt 202 lb 12 oz (92 kg)   SpO2 95%   BMI 28.28 kg/m   Physical Exam  Constitutional: He is oriented to person, place, and time. He appears well-developed and well-nourished.  HENT:  Head: Normocephalic and atraumatic.  Eyes: Conjunctivae and EOM are normal. Pupils are equal, round, and reactive to light.  Neck: Normal range of motion. Neck supple.  Cardiovascular: Normal rate, regular rhythm, normal heart sounds and intact distal pulses.  Pulmonary/Chest: Effort normal and breath sounds normal.  Abdominal: Soft. He exhibits no ascites. Bowel sounds are increased. There is no rigidity, no rebound, no guarding and no CVA tenderness.  Abdomen soft, nondistended.  Slightly tender to palpation in mid lower abdomen.  Rectal exam performed.  No blood on exam glove.  Stool was brown.  Hemoccult testing was positive for blood.  No masses palpated.    Neurological: He is alert and oriented to person, place, and time.  Skin: Skin is warm and dry.  Vitals reviewed.    Assessment and Plan  1. Rectal bleeding Patient with new onset rectal bleeding and mild lower abdominal cramping over the past 2 weeks.  Hemoccult positive in office today.  Counseled patient regarding worrisome signs and symptoms of rectal bleeding and if occur to go to the emergency department or call 911.  Called and scheduled appointment for patient to be seen by gastroenterology.  Will check CBC today.  Note given for work. - CBC with Differential/Platelet - POC Hemoccult Bld/Stl (1-Cd Office Dx) - Ambulatory referral to Gastroenterology -Patient was told to call with any questions or concerns and please  to keep his scheduled follow-up with GI. Return in about 2 months (around 12/08/2017) for Follow-up. Aliene Beams, MD 10/10/2017

## 2017-10-12 ENCOUNTER — Encounter: Payer: Self-pay | Admitting: *Deleted

## 2017-10-12 ENCOUNTER — Encounter: Payer: Self-pay | Admitting: Gastroenterology

## 2017-10-12 ENCOUNTER — Other Ambulatory Visit: Payer: Self-pay | Admitting: *Deleted

## 2017-10-12 ENCOUNTER — Ambulatory Visit: Payer: BLUE CROSS/BLUE SHIELD | Admitting: Gastroenterology

## 2017-10-12 ENCOUNTER — Telehealth: Payer: Self-pay | Admitting: *Deleted

## 2017-10-12 DIAGNOSIS — K625 Hemorrhage of anus and rectum: Secondary | ICD-10-CM

## 2017-10-12 MED ORDER — PEG 3350-KCL-NA BICARB-NACL 420 G PO SOLR: 4000.0000 mL | Freq: Once | ORAL | 0 refills | Status: AC

## 2017-10-12 MED ORDER — HYDROCORTISONE 2.5 % RE CREA: 1.0000 "application " | TOPICAL_CREAM | Freq: Two times a day (BID) | RECTAL | 1 refills | Status: DC

## 2017-10-12 NOTE — Telephone Encounter (Signed)
Checked [employer] insurance [employer] and was advised no PA was needed for TCS.

## 2017-10-12 NOTE — Progress Notes (Signed)
cc'ed to pcp °

## 2017-10-12 NOTE — Progress Notes (Signed)
Primary Care Physician:  Aliene Beams, MD Primary Gastroenterologist:  Dr. Darrick Penna   Chief Complaint  Patient presents with  . Blood In Stools    HPI:   Tyrone Baker is a 42 y.o. male presenting today at the request of Dr. Aliene Beams secondary to rectal bleeding. He reports a history of chronic rectal bleeding but increased in frequency and amount starting several weeks ago. He was actually seen by Dr. Tracie Harrier Oct 10, 2017. Heme positive at that visit.   Tammy, girlfriend, present. States rectal bleeding chronic but worse over the past 2 weeks. Rectal bleeding noted with bowel movements. Notes some straining. Burning in rectum. BM doesn't feel productive at times. Bristol scale #3. Doesn't drink water or eat vegetables. Likes sugary stuff. Cramping lower abdomen. No fever or chills. No N/V. No weight loss or lack of appetite.    Past Medical History:  Diagnosis Date  . Asthma   . Pericarditis     Past Surgical History:  Procedure Laterality Date  . None      Current Outpatient Medications  Medication Sig Dispense Refill  . albuterol (PROVENTIL HFA;VENTOLIN HFA) 108 (90 Base) MCG/ACT inhaler Inhale 1-2 puffs into the lungs every 6 (six) hours as needed for wheezing or shortness of breath. 2 Inhaler 0   No current facility-administered medications for this visit.     Allergies as of 10/12/2017  . (No Known Allergies)    Family History  Problem Relation Age of Onset  . Hypertension Mother   . Diabetes Father   . Colon cancer Neg Hx   . Colon polyps Neg Hx     Social History   Socioeconomic History  . Marital status: Single    Spouse name: Not on file  . Number of children: Not on file  . Years of education: Not on file  . Highest education level: Not on file  Social Needs  . Financial resource strain: Not on file  . Food insecurity - worry: Not on file  . Food insecurity - inability: Not on file  . Transportation needs - medical: Not on file  .  Transportation needs - non-medical: Not on file  Occupational History  . Not on file  Tobacco Use  . Smoking status: Former Games developer  . Smokeless tobacco: Never Used  . Tobacco comment: rare black and mild   Substance and Sexual Activity  . Alcohol use: No  . Drug use: No  . Sexual activity: Yes    Birth control/protection: None  Other Topics Concern  . Not on file  Social History Narrative   Works at [employer]. Divorced. Dating. Has a son, who is 77 years old.     Review of Systems: Gen: Denies any fever, chills, fatigue, weight loss, lack of appetite.  CV: Denies chest pain, heart palpitations, peripheral edema, syncope.  Resp: Denies shortness of breath at rest or with exertion. Denies wheezing or cough.  GI: see HPI  GU : Denies urinary burning, urinary frequency, urinary hesitancy MS: Denies joint pain, muscle weakness, cramps, or limitation of movement.  Derm: Denies rash, itching, dry skin Psych: Denies depression, anxiety, memory loss, and confusion Heme: +bleeding   Physical Exam: BP 135/88   Pulse 89   Temp (!) 97 F (36.1 C) (Oral)   Ht 5\' 11"  (1.803 m)   Wt 202 lb (91.6 kg)   BMI 28.17 kg/m  General:   Alert and oriented. Pleasant and cooperative. Well-nourished and well-developed.  Head:  Normocephalic and atraumatic. Eyes:  Without icterus, sclera clear and conjunctiva pink.  Ears:  Normal auditory acuity. Nose:  No deformity, discharge,  or lesions. Mouth:  No deformity or lesions, oral mucosa pink.  Lungs:  Clear to auscultation bilaterally. No wheezes, rales, or rhonchi. No distress.  Heart:  S1, S2 present without murmurs appreciated.  Abdomen:  +BS, soft, non-tender and non-distended. No HSM noted. No guarding or rebound. No masses appreciated.  Rectal:  Deferred  Msk:  Symmetrical without gross deformities. Normal posture. Extremities:  Without edema. Neurologic:  Alert and  oriented x4;  grossly normal neurologically. Psych:  Alert and  cooperative. Normal mood and affect.

## 2017-10-12 NOTE — Assessment & Plan Note (Signed)
42 year old pleasant male with a history of chronic, intermittent rectal bleeding over the years but now with a 2 week history of increased frequency and amount. Endorses straining, some rectal discomfort. Heme positive through PCP. No prior colonoscopy. Query related to internal hemorrhoids but unable to exclude occult etiology. Discussed incorporation of high fiber diet, avoidance of straining, added Anusol and sent to pharmacy, and arranging a colonoscopy in the near future.  Proceed with colonoscopy with Dr. Darrick PennaFields in the near future. The risks, benefits, and alternatives have been discussed in detail with the patient. They state understanding and desire to proceed.  Possible hemorrhoid banding if appropriate at time of procedure Anusol cream BID sent to pharmacy

## 2017-10-12 NOTE — Patient Instructions (Addendum)
We have scheduled you for a colonoscopy with Dr. Darrick PennaFields in the near future.  I have sent in a cream to use twice a day per rectum.  Increase your water intake as we talked about. Increase your fiber intake and avoid straining.   Further recommendations to follow!  It was a pleasure to see you today. I strive to create trusting relationships with patients to provide genuine, compassionate, and quality care. I value your feedback. If you receive a survey regarding your visit,  I greatly appreciate you the taking time to fill this out.   Gelene MinkAnna W. Zena Vitelli, PhD, ANP-BC [facility]    High-Fiber Diet Fiber, also called dietary fiber, is a type of carbohydrate found in fruits, vegetables, whole grains, and beans. A high-fiber diet can have many health benefits. Your health care provider may recommend a high-fiber diet to help:  Prevent constipation. Fiber can make your bowel movements more regular.  Lower your cholesterol.  Relieve hemorrhoids, uncomplicated diverticulosis, or irritable bowel syndrome.  Prevent overeating as part of a weight-loss plan.  Prevent heart disease, type 2 diabetes, and certain cancers.  What is my plan? The recommended daily intake of fiber includes:  38 grams for men under age 41.  30 grams for men over age 41.  25 grams for women under age 41.  21 grams for women over age 41.  You can get the recommended daily intake of dietary fiber by eating a variety of fruits, vegetables, grains, and beans. Your health care provider may also recommend a fiber supplement if it is not possible to get enough fiber through your diet. What do I need to know about a high-fiber diet?  Fiber supplements have not been widely studied for their effectiveness, so it is better to get fiber through food sources.  Always check the fiber content on thenutrition facts label of any prepackaged food. Look for foods that contain at least 5 grams of fiber per  serving.  Ask your dietitian if you have questions about specific foods that are related to your condition, especially if those foods are not listed in the following section.  Increase your daily fiber consumption gradually. Increasing your intake of dietary fiber too quickly may cause bloating, cramping, or gas.  Drink plenty of water. Water helps you to digest fiber. What foods can I eat? Grains Whole-grain breads. Multigrain cereal. Oats and oatmeal. Brown rice. Barley. Bulgur wheat. Millet. Bran muffins. Popcorn. Rye wafer crackers. Vegetables Sweet potatoes. Spinach. Kale. Artichokes. Cabbage. Broccoli. Green peas. Carrots. Squash. Fruits Berries. Pears. Apples. Oranges. Avocados. Prunes and raisins. Dried figs. Meats and Other Protein Sources Navy, kidney, pinto, and soy beans. Split peas. Lentils. Nuts and seeds. Dairy Fiber-fortified yogurt. Beverages Fiber-fortified soy milk. Fiber-fortified orange juice. Other Fiber bars. The items listed above may not be a complete list of recommended foods or beverages. Contact your dietitian for more options. What foods are not recommended? Grains White bread. Pasta made with refined flour. White rice. Vegetables Fried potatoes. Canned vegetables. Well-cooked vegetables. Fruits Fruit juice. Cooked, strained fruit. Meats and Other Protein Sources Fatty cuts of meat. Fried Environmental education officerpoultry or fried fish. Dairy Milk. Yogurt. Cream cheese. Sour cream. Beverages Soft drinks. Other Cakes and pastries. Butter and oils. The items listed above may not be a complete list of foods and beverages to avoid. Contact your dietitian for more information. What are some tips for including high-fiber foods in my diet?  Eat a wide variety of high-fiber foods.  Make sure  that half of all grains consumed each day are whole grains.  Replace breads and cereals made from refined flour or white flour with whole-grain breads and cereals.  Replace white rice  with brown rice, bulgur wheat, or millet.  Start the day with a breakfast that is high in fiber, such as a cereal that contains at least 5 grams of fiber per serving.  Use beans in place of meat in soups, salads, or pasta.  Eat high-fiber snacks, such as berries, raw vegetables, nuts, or popcorn. This information is not intended to replace advice given to you by your health care provider. Make sure you discuss any questions you have with your health care provider. Document Released: 08/29/2005 Document Revised: 02/04/2016 Document Reviewed: 02/11/2014 Elsevier Interactive Patient Education  [employer].

## 2017-11-13 ENCOUNTER — Ambulatory Visit (HOSPITAL_COMMUNITY)
Admission: RE | Admit: 2017-11-13 | Payer: BLUE CROSS/BLUE SHIELD | Source: Ambulatory Visit | Admitting: Gastroenterology

## 2017-11-13 ENCOUNTER — Encounter: Payer: Self-pay | Admitting: *Deleted

## 2017-11-13 ENCOUNTER — Encounter (HOSPITAL_COMMUNITY): Admission: RE | Payer: Self-pay | Source: Ambulatory Visit

## 2017-11-13 ENCOUNTER — Other Ambulatory Visit: Payer: Self-pay | Admitting: *Deleted

## 2017-11-13 DIAGNOSIS — K625 Hemorrhage of anus and rectum: Secondary | ICD-10-CM

## 2017-11-13 SURGERY — COLONOSCOPY
Anesthesia: Moderate Sedation

## 2017-11-13 NOTE — Telephone Encounter (Signed)
REVIEWED-NO ADDITIONAL RECOMMENDATIONS. 

## 2017-11-13 NOTE — Telephone Encounter (Signed)
[facility] and [facility] stated that the patient no-showed for his procedure today and she has been unable to reach him

## 2017-11-13 NOTE — Telephone Encounter (Signed)
Spoke with patient and he reports he forgot about the TCS being scheduled for today. He asked to be rescheduled. He is now scheduled for 12/04/17 at 2:15pm. Patient aware new prep instructions have been mailed to him.

## 2017-12-04 ENCOUNTER — Telehealth: Payer: Self-pay

## 2017-12-04 ENCOUNTER — Encounter (HOSPITAL_COMMUNITY): Admission: RE | Payer: Self-pay | Source: Ambulatory Visit

## 2017-12-04 ENCOUNTER — Ambulatory Visit (HOSPITAL_COMMUNITY)
Admission: RE | Admit: 2017-12-04 | Payer: BLUE CROSS/BLUE SHIELD | Source: Ambulatory Visit | Admitting: Gastroenterology

## 2017-12-04 SURGERY — COLONOSCOPY
Anesthesia: Moderate Sedation

## 2017-12-04 NOTE — Telephone Encounter (Signed)
Cancelled (no-showed) in March 2019 stating he had forgotten. Was rescheduled and now difficulty with prep. Would be best to have him follow-up with a non-urgent appt so we can ensure he is not lost to follow-up, discuss possible prep choices, and tie up any loose ends.

## 2017-12-04 NOTE — Telephone Encounter (Signed)
Pt's wife called office to cancel colonoscopy for today. He got sick after he started drinking prep. Started throwing up after 3rd glass. Having nausea and abdominal cramps this morning. Doesn't want to do procedure at this time. Called and informed Endo scheduler.

## 2017-12-04 NOTE — Telephone Encounter (Signed)
Called and informed pt. OV made.

## 2018-02-15 ENCOUNTER — Encounter: Payer: Self-pay | Admitting: Gastroenterology

## 2018-02-15 ENCOUNTER — Telehealth: Payer: Self-pay | Admitting: Gastroenterology

## 2018-02-15 ENCOUNTER — Ambulatory Visit: Payer: BLUE CROSS/BLUE SHIELD | Admitting: Gastroenterology

## 2018-02-15 NOTE — Telephone Encounter (Signed)
PATIENT WAS A NO SHOW AND LETTER SENT  °

## 2018-02-16 ENCOUNTER — Encounter: Payer: Self-pay | Admitting: Family Medicine

## 2018-09-13 IMAGING — DX DG CHEST 2V
2 series · 2 of 2 positions shown · non-contrast
Comparison: PA and lateral chest 06/04/2015.

CLINICAL DATA: Acute onset left side chest pain and heaviness last
night.

EXAM:
CHEST  2 VIEW

[chest pa]
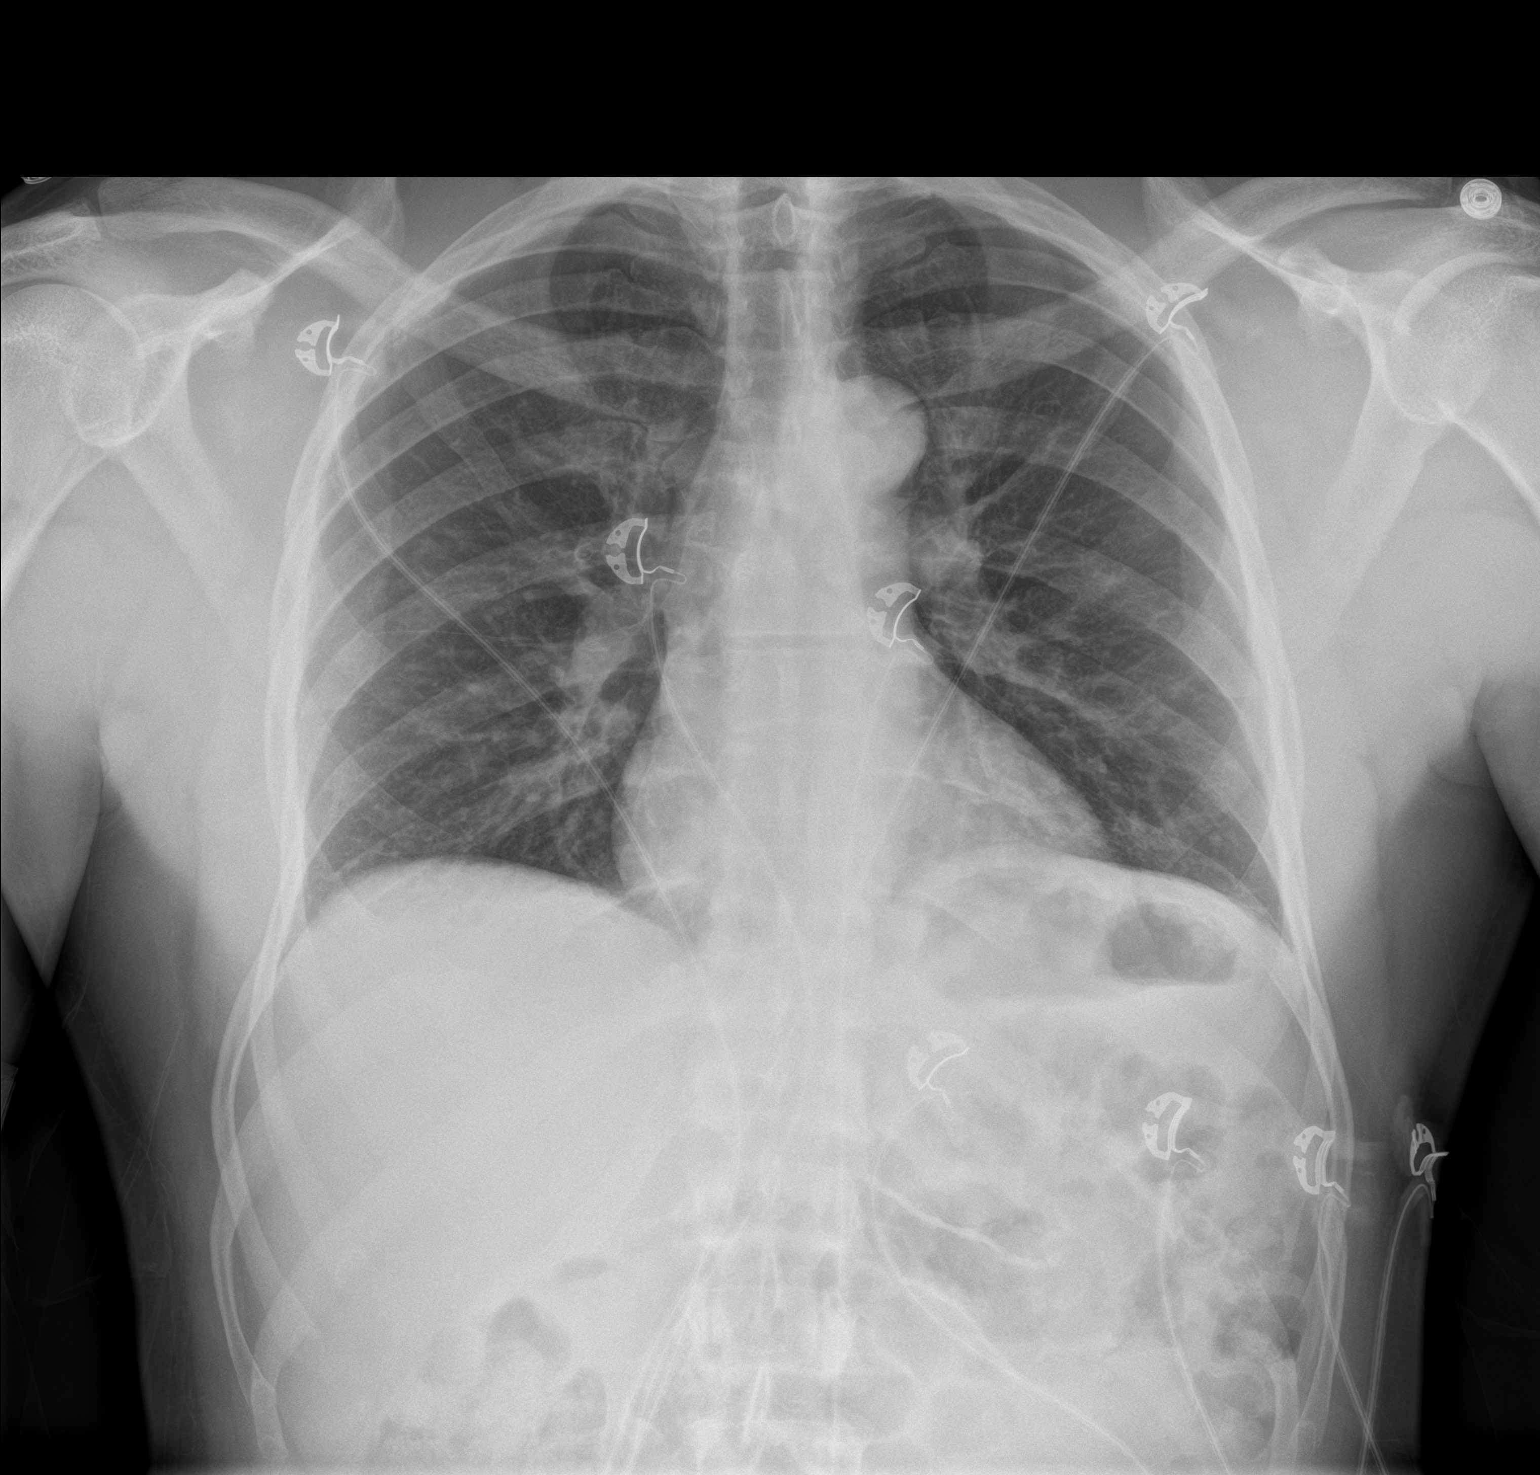

[chest lat]
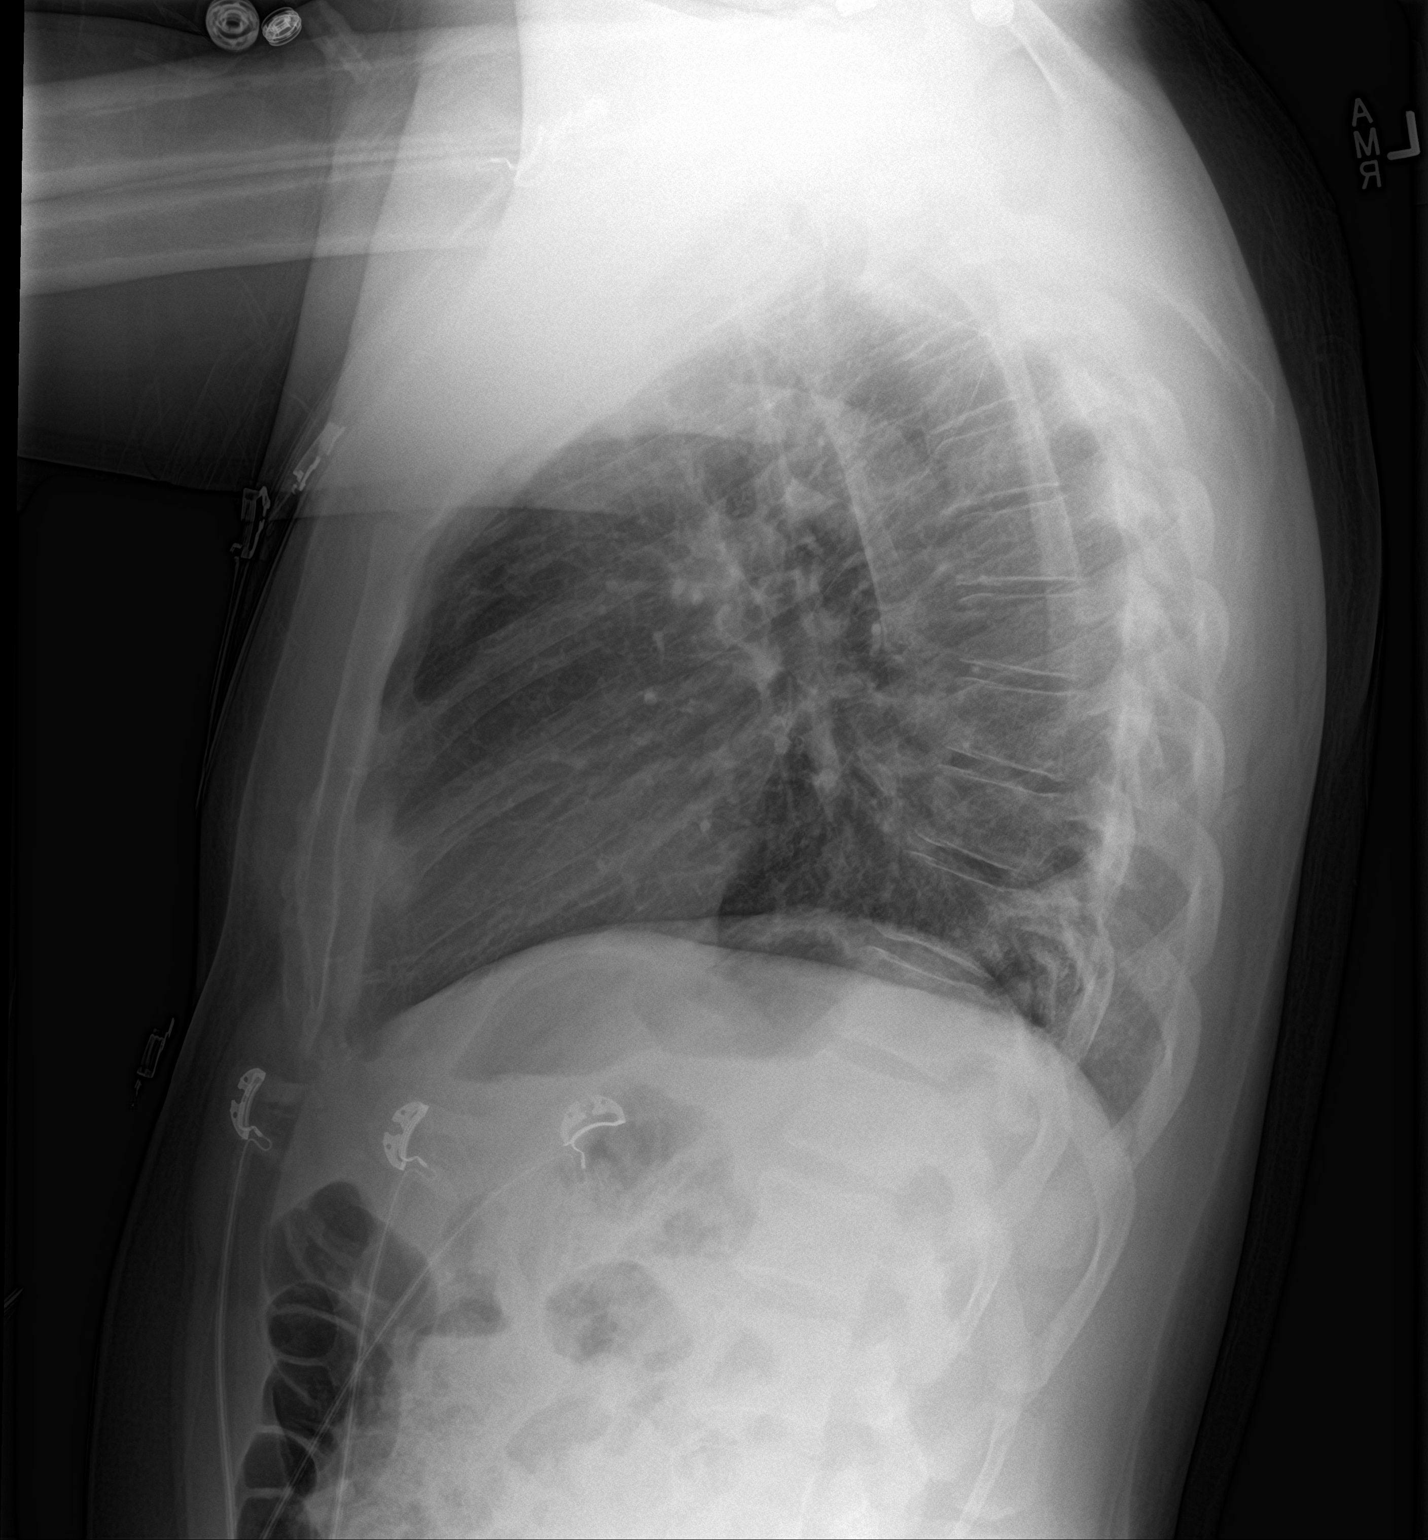

[2 of 2 positions shown; findings below may reference images not displayed]

FINDINGS: The lungs are clear. Heart size is normal. No pneumothorax or
pleural fluid. No bony abnormality.
IMPRESSION: Negative chest.

## 2018-09-21 IMAGING — DX DG CHEST 2V
2 series · 2 of 2 positions shown · non-contrast
Comparison: Prior radiograph from 05/02/2017.

CLINICAL DATA: Initial evaluation for productive cough for 2 weeks.
Left-sided chest pain.

EXAM:
CHEST  2 VIEW

[chest pa]
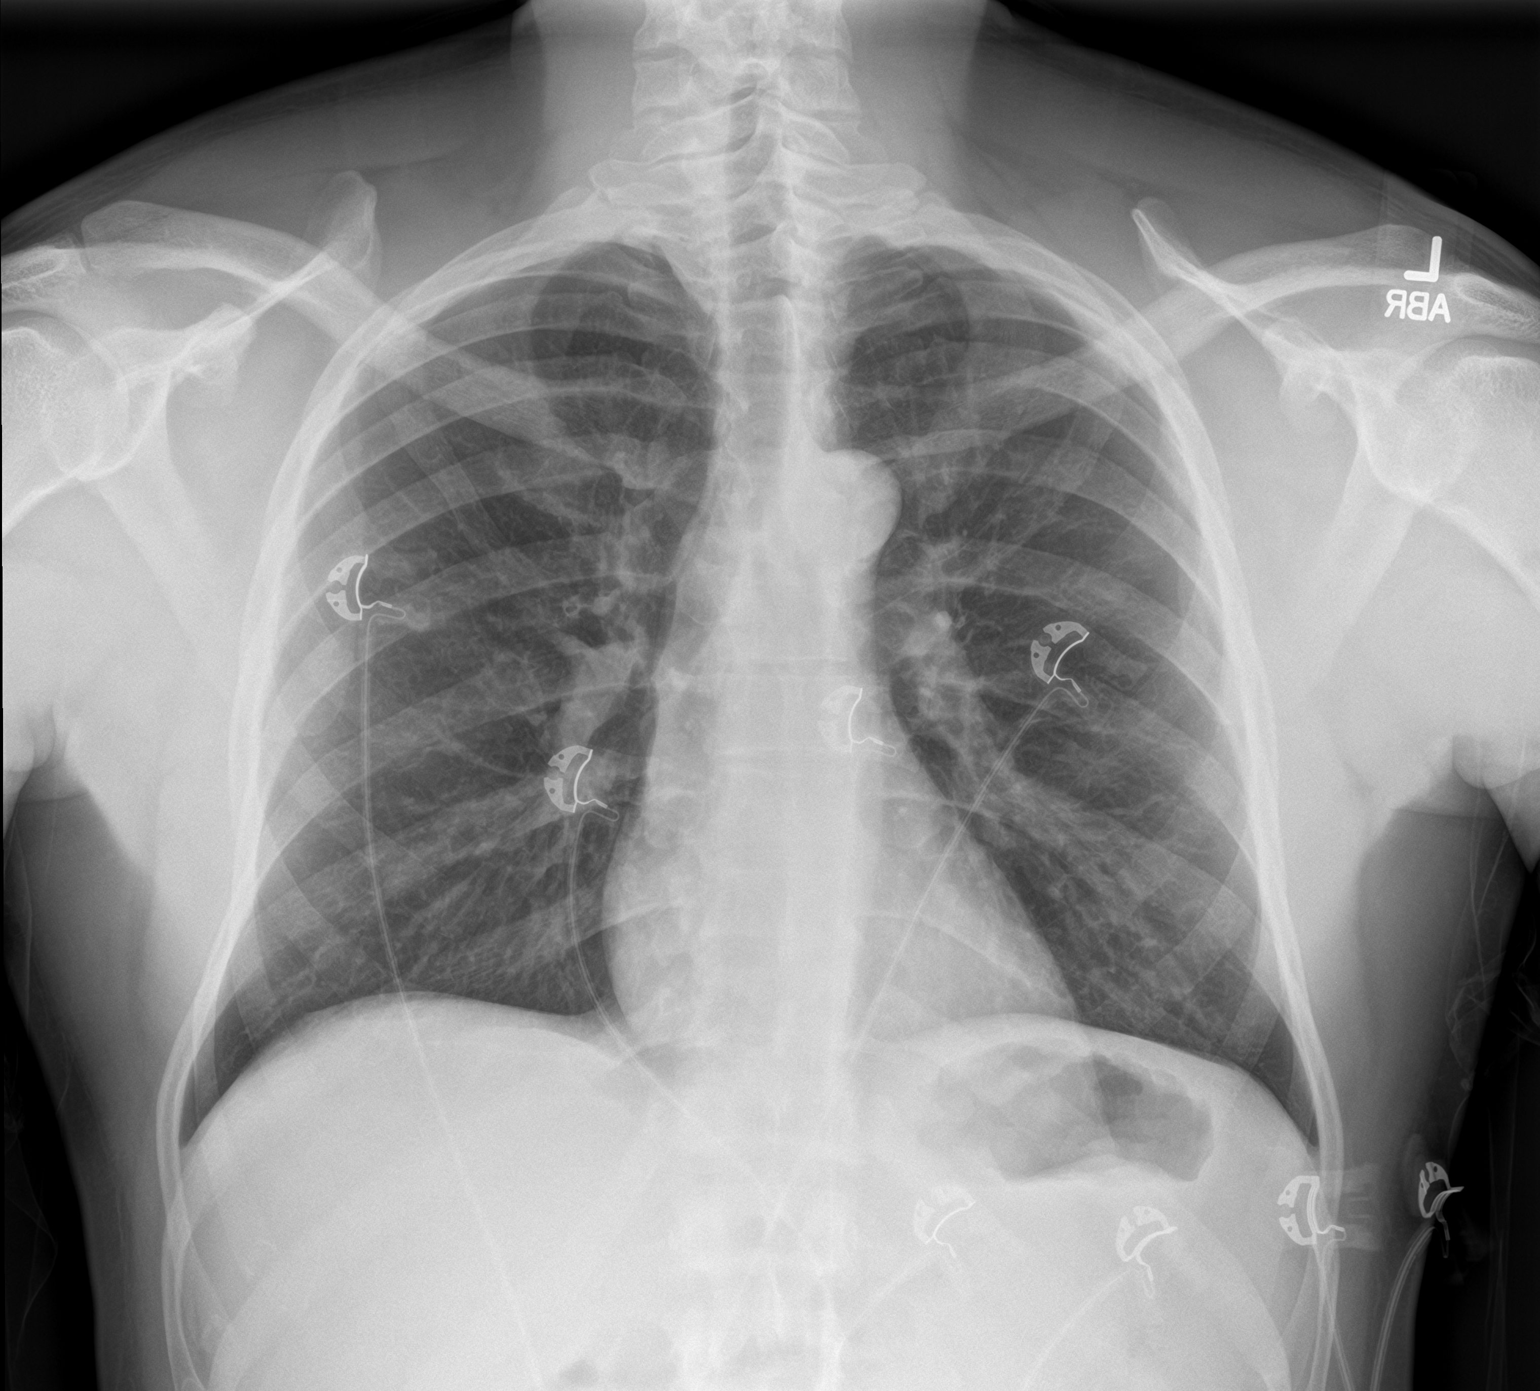

[chest lat]
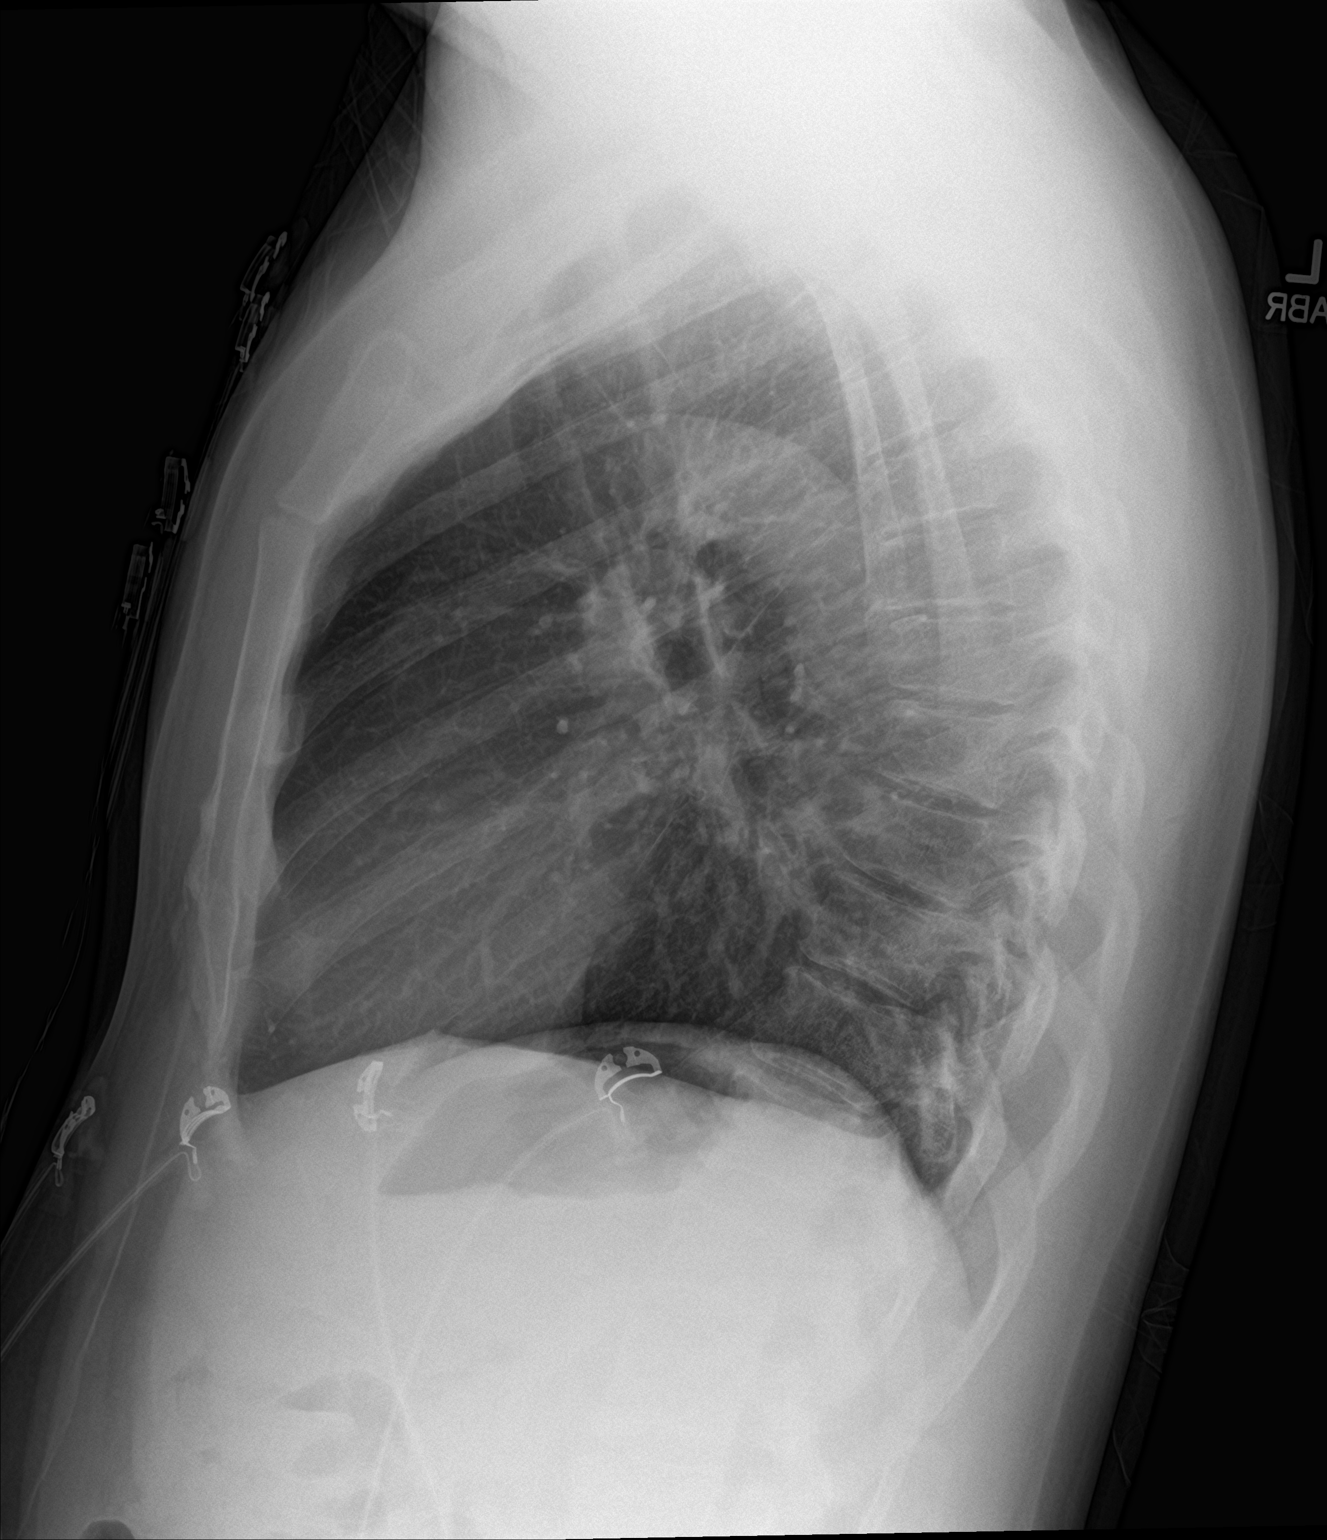

[2 of 2 positions shown; findings below may reference images not displayed]

FINDINGS: The cardiac and mediastinal silhouettes are stable in size and
contour, and remain within normal limits.

The lungs are normally inflated. No airspace consolidation, pleural
effusion, or pulmonary edema is identified. There is no
pneumothorax.

No acute osseous abnormality identified.
IMPRESSION: No radiographic evidence for active cardiopulmonary disease.

## 2022-08-26 ENCOUNTER — Encounter (HOSPITAL_COMMUNITY): Payer: Self-pay

## 2022-08-26 ENCOUNTER — Emergency Department (HOSPITAL_COMMUNITY): Payer: BC Managed Care – PPO

## 2022-08-26 ENCOUNTER — Other Ambulatory Visit: Payer: Self-pay

## 2022-08-26 ENCOUNTER — Emergency Department (HOSPITAL_COMMUNITY)
Admission: EM | Admit: 2022-08-26 | Discharge: 2022-08-26 | Disposition: A | Discharge status: Home / Self Care | Payer: BC Managed Care – PPO | Attending: Emergency Medicine | Admitting: Emergency Medicine

## 2022-08-26 DIAGNOSIS — R0789 Other chest pain: Secondary | ICD-10-CM | POA: Diagnosis not present

## 2022-08-26 DIAGNOSIS — R079 Chest pain, unspecified: Secondary | ICD-10-CM | POA: Diagnosis not present

## 2022-08-26 LAB — CBC WITH DIFFERENTIAL/PLATELET
Abs Immature Granulocytes: 0.01 10*3/uL (ref 0.00–0.07)
Basophils Absolute: 0 10*3/uL (ref 0.0–0.1)
Basophils Relative: 1 %
Eosinophils Absolute: 0.1 10*3/uL (ref 0.0–0.5)
Eosinophils Relative: 3 %
HCT: 45.2 % (ref 39.0–52.0)
Hemoglobin: 14.8 g/dL (ref 13.0–17.0)
Immature Granulocytes: 0 %
Lymphocytes Relative: 40 %
Lymphs Abs: 1.5 10*3/uL (ref 0.7–4.0)
MCH: 30 pg (ref 26.0–34.0)
MCHC: 32.7 g/dL (ref 30.0–36.0)
MCV: 91.5 fL (ref 80.0–100.0)
Monocytes Absolute: 0.4 10*3/uL (ref 0.1–1.0)
Monocytes Relative: 12 %
Neutro Abs: 1.6 10*3/uL — ABNORMAL LOW (ref 1.7–7.7)
Neutrophils Relative %: 44 %
Platelets: 253 10*3/uL (ref 150–400)
RBC: 4.94 MIL/uL (ref 4.22–5.81)
RDW: 12.2 % (ref 11.5–15.5)
WBC: 3.6 10*3/uL — ABNORMAL LOW (ref 4.0–10.5)
nRBC: 0 % (ref 0.0–0.2)

## 2022-08-26 LAB — COMPREHENSIVE METABOLIC PANEL
ALT: 25 U/L (ref 0–44)
AST: 26 U/L (ref 15–41)
Albumin: 3.8 g/dL (ref 3.5–5.0)
Alkaline Phosphatase: 61 U/L (ref 38–126)
Anion gap: 5 (ref 5–15)
BUN: 14 mg/dL (ref 6–20)
CO2: 27 mmol/L (ref 22–32)
Calcium: 8.9 mg/dL (ref 8.9–10.3)
Chloride: 108 mmol/L (ref 98–111)
Creatinine, Ser: 1.23 mg/dL (ref 0.61–1.24)
GFR, Estimated: >60 mL/min (ref >60)
Glucose, Bld: 99 mg/dL (ref 70–99)
Potassium: 4.4 mmol/L (ref 3.5–5.1)
Sodium: 140 mmol/L (ref 135–145)
Total Bilirubin: 0.7 mg/dL (ref 0.3–1.2)
Total Protein: 7.9 g/dL (ref 6.5–8.1)

## 2022-08-26 LAB — TROPONIN I (HIGH SENSITIVITY)
Troponin I (High Sensitivity): 2 ng/L (ref <18)
Troponin I (High Sensitivity): <2 ng/L (ref <18)

## 2022-08-26 MED ORDER — NITROGLYCERIN 0.4 MG SL SUBL
0.4000 mg | SUBLINGUAL_TABLET | Freq: Once | SUBLINGUAL | Status: AC
  Administered 2022-08-26: 0.4 mg via SUBLINGUAL
  Filled 2022-08-26: qty 1

## 2022-08-26 MED ORDER — SODIUM CHLORIDE 0.9 % IV BOLUS
500.0000 mL | Freq: Once | INTRAVENOUS | Status: AC
  Administered 2022-08-26: 500 mL via INTRAVENOUS

## 2022-08-26 NOTE — Discharge Instructions (Signed)
Take 1 baby aspirin a day.  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  Return if any problems

## 2022-08-26 NOTE — ED Provider Notes (Signed)
[facility] EMERGENCY DEPARTMENT Provider Note   CSN: [id number] Arrival date & time: 08/26/22  0732     History {Add pertinent medical, surgical, social history, OB history to HPI:1} Chief Complaint  Patient presents with   Chest Pain    Tyrone Baker is a 46 y.o. male.  Patient has no medical problems.  He has been having some chest tightness recently.  He states he does a lot of heavy work at work and thought maybe he pulled a muscle.  He has been having some tightness now for 24 hours   Chest Pain      Home Medications Prior to Admission medications   Medication Sig Start Date End Date Taking? Authorizing Provider  albuterol (PROVENTIL HFA;VENTOLIN HFA) 108 (90 Base) MCG/ACT inhaler Inhale 1-2 puffs into the lungs every 6 (six) hours as needed for wheezing or shortness of breath. 05/17/17   Aliene Beams, MD  hydrocortisone (ANUSOL-HC) 2.5 % rectal cream Place 1 application rectally 2 (two) times daily. Patient taking differently: Place 1 application rectally 2 (two) times daily as needed for hemorrhoids.  10/12/17   Gelene Mink, NP      Allergies    Patient has no known allergies.    Review of Systems   Review of Systems  Cardiovascular:  Positive for chest pain.    Physical Exam Updated Vital Signs BP (!) 123/96   Pulse (!) 55   Temp 97.9 F (36.6 C) (Oral)   Resp 11   Ht 5\' 6"  (1.676 m)   Wt 81.6 kg   SpO2 98%   BMI 29.05 kg/m  Physical Exam  ED Results / Procedures / Treatments   Labs (all labs ordered are listed, but only abnormal results are displayed) Labs Reviewed  CBC WITH DIFFERENTIAL/PLATELET - Abnormal; Notable for the following components:      Result Value   WBC 3.6 (*)    Neutro Abs 1.6 (*)    All other components within normal limits  COMPREHENSIVE METABOLIC PANEL  TROPONIN I (HIGH SENSITIVITY)  TROPONIN I (HIGH SENSITIVITY)    EKG EKG Interpretation  Date/Time:  Friday August 26 2022 07:46:59 EST Ventricular Rate:   73 PR Interval:  176 QRS Duration: 86 QT Interval:  363 QTC Calculation: 400 R Axis:   68 Text Interpretation: Sinus rhythm ST elev, probable normal early repol pattern Confirmed by [id number] [phone]) on 08/26/2022 12:12:32 PM  Radiology DG Chest Port 1 View  Result Date: 08/26/2022 CLINICAL DATA:  Chest pain. EXAM: PORTABLE CHEST 1 VIEW COMPARISON:  May 10, 2017. FINDINGS: The heart size and mediastinal contours are within normal limits. Both lungs are clear. The visualized skeletal structures are unremarkable. IMPRESSION: No active disease. Electronically Signed   By: May 12, 2017 M.D.   On: 08/26/2022 08:33    Procedures Procedures  {Document cardiac monitor, telemetry assessment procedure when appropriate:1}  Medications Ordered in ED Medications  sodium chloride 0.9 % bolus 500 mL (0 mLs Intravenous Stopped 08/26/22 0905)  nitroGLYCERIN (NITROSTAT) SL tablet 0.4 mg (0.4 mg Sublingual Given 08/26/22 08/28/22)    ED Course/ Medical Decision Making/ A&P  Patient's chest tightness was helped with the 1 nitro.  Troponin x 2 is negative EKG unremarkable.  I spoke with cardiology and the patient will be discharged home for outpatient follow-up and started on an aspirin a day  Medical Decision Making Amount and/or Complexity of Data Reviewed Labs: ordered. Radiology: ordered. ECG/medicine tests: ordered.  Risk Prescription drug management.   Atypical chest pain  {Document critical care time when appropriate:1} {Document review of labs and clinical decision tools ie heart score, Chads2Vasc2 etc:1}  {Document your independent review of radiology images, and any outside records:1} {Document your discussion with family members, caretakers, and with consultants:1} {Document social determinants of health affecting pt's care:1} {Document your decision making why or why not admission, treatments were needed:1} Final Clinical Impression(s) / ED  Diagnoses Final diagnoses:  Atypical chest pain    Rx / DC Orders ED Discharge Orders     None

## 2022-08-26 NOTE — ED Triage Notes (Signed)
Patient reports chest pain that he describes as pressure that started yesterday. States that  he has pain in left arm as well. Believes that he could have pulled a muscle due lifting and pulling while at work. HX of Pericarditis.

## 2022-09-08 ENCOUNTER — Encounter: Payer: Self-pay | Admitting: Internal Medicine

## 2022-09-08 ENCOUNTER — Ambulatory Visit: Payer: BC Managed Care – PPO | Attending: Internal Medicine | Admitting: Internal Medicine

## 2022-09-08 VITALS — BP 141/93 | HR 90 | Wt 191.2 lb

## 2022-09-08 DIAGNOSIS — R030 Elevated blood-pressure reading, without diagnosis of hypertension: Secondary | ICD-10-CM | POA: Diagnosis not present

## 2022-09-08 DIAGNOSIS — R079 Chest pain, unspecified: Secondary | ICD-10-CM

## 2022-09-08 DIAGNOSIS — R072 Precordial pain: Secondary | ICD-10-CM | POA: Diagnosis not present

## 2022-09-08 MED ORDER — ALBUTEROL SULFATE HFA 108 (90 BASE) MCG/ACT IN AERS: 1.0000 | INHALATION_SPRAY | Freq: Four times a day (QID) | RESPIRATORY_TRACT | 0 refills | Status: AC | PRN

## 2022-09-08 NOTE — Patient Instructions (Addendum)
Medication Instructions:  Your physician recommends that you continue on your current medications as directed. Please refer to the Current Medication list given to you today.  *If you need a refill on your cardiac medications before your next appointment, please call your pharmacy*   Lab Work: NONE ordered at this time of appointment   If you have labs (blood work) drawn today and your tests are completely normal, you will receive your results only by: MyChart Message (if you have MyChart) OR A paper copy in the mail If you have any lab test that is abnormal or we need to change your treatment, we will call you to review the results.   Testing/Procedures: Your physician has requested that you have an echocardiogram. Echocardiography is a painless test that uses sound waves to create images of your heart. It provides your doctor with information about the size and shape of your heart and how well your heart's chambers and valves are working. This procedure takes approximately one hour. There are no restrictions for this procedure. Please do NOT wear cologne, perfume, aftershave, or lotions (deodorant is allowed). Please arrive 15 minutes prior to your appointment time.   Your physician has requested that you have en exercise stress myoview. For further information please visit [email or web address] Please follow instruction sheet, as given.    Follow-Up: At [facility], you and your health needs are our priority.  As part of our continuing mission to provide you with exceptional heart care, we have created designated Provider Care Teams.  These Care Teams include your primary Cardiologist (physician) and Advanced Practice Providers (APPs -  Physician Assistants and Nurse Practitioners) who all work together to provide you with the care you need, when you need it.  We recommend signing up for the patient portal called "MyChart".  Sign up information is provided on this After Visit  Summary.  MyChart is used to connect with patients for Virtual Visits (Telemedicine).  Patients are able to view lab/test results, encounter notes, upcoming appointments, etc.  Non-urgent messages can be sent to your provider as well.   To learn more about what you can do with MyChart, go to [email or web address].    Your next appointment:    As needed   The format for your next appointment:   In Person  Provider:   Luane School, MD    Other Instructions Recommend finding a Primary Care Doctor   Important Information About Sugar

## 2022-09-08 NOTE — Progress Notes (Addendum)
Cardiology Office Note  Date: 09/08/2022   ID: Tyrone Baker, DOB 12/06/1975, MRN [id number]  PCP:  Pcp, No  Cardiologist:  None Electrophysiologist:  None   Reason for Office Visit: Chest pain evaluation   History of Present Illness: Tyrone Baker is a 46 y.o. male with no past medical history was referred to cardiology for chest pain evaluation after ER visit.  Patient had ER visit on 08/26/2022 for substernal chest pain radiating to the left arm that started when he was lifting weights at his work and lasted for the whole 2 days until he arrived to the ER. Chest pain completely resolved after administering SL NTG 0.4 mg one-time dose. He was subsequently discharged with outpatient cardiology follow-up. Upon discharge from the ER, he denied having further episodes of chest pain. He was off from work for a few days and resumed his work. His work involves lifting weights and he did not have any chest pains with it after ER discharge.  He never had this kind of chest pain in the past.  Denies any DOE, dizziness, lightness, syncope, LE swelling.  No family history premature ASCVD.  Denies smoking cigarettes, alcohol use and illicit drug abuse. He does not check his blood pressure at home.   Past Medical History:  Diagnosis Date   Asthma    Pericarditis     Past Surgical History:  Procedure Laterality Date   None      Current Outpatient Medications  Medication Sig Dispense Refill   albuterol (PROVENTIL HFA;VENTOLIN HFA) 108 (90 Base) MCG/ACT inhaler Inhale 1-2 puffs into the lungs every 6 (six) hours as needed for wheezing or shortness of breath. 2 Inhaler 0   hydrocortisone (ANUSOL-HC) 2.5 % rectal cream Place 1 application rectally 2 (two) times daily. (Patient taking differently: Place 1 application  rectally 2 (two) times daily as needed for hemorrhoids.) 30 g 1   No current facility-administered medications for this visit.   Allergies:  Patient has no known allergies.    Social History: The patient  reports that he has quit smoking. He has never used smokeless tobacco. He reports that he does not drink alcohol and does not use drugs.   Family History: The patient's family history includes Diabetes in his father; Hypertension in his mother.   ROS:  Please see the history of present illness. Otherwise, complete review of systems is positive for none.  All other systems are reviewed and negative.   Physical Exam: VS:  BP (!) 141/93 (BP Location: Left Arm, Patient Position: Sitting, Cuff Size: Large)   Pulse 90   Wt 191 lb 3.2 oz (86.7 kg)   BMI 30.86 kg/m , BMI Body mass index is 30.86 kg/m.  Wt Readings from Last 3 Encounters:  09/08/22 191 lb 3.2 oz (86.7 kg)  08/26/22 180 lb (81.6 kg)  10/12/17 202 lb (91.6 kg)    General: Patient appears comfortable at rest. HEENT: Conjunctiva and lids normal, oropharynx clear with moist mucosa. Neck: Supple, no elevated JVP or carotid bruits, no thyromegaly. Lungs: Clear to auscultation, nonlabored breathing at rest. Cardiac: Regular rate and rhythm, no S3 or significant systolic murmur, no pericardial rub. Abdomen: Soft, nontender, no hepatomegaly, bowel sounds present, no guarding or rebound. Extremities: No pitting edema, distal pulses 2+. Skin: Warm and dry. Musculoskeletal: No kyphosis. Neuropsychiatric: Alert and oriented x3, affect grossly appropriate.  ECG: EKG performed in the ER showed normal sinus rhythm with no ST-T changes  Recent Labwork: 08/26/2022: ALT 25;  AST 26; BUN 14; Creatinine, Ser 1.23; Hemoglobin 14.8; Platelets 253; Potassium 4.4; Sodium 140     Component Value Date/Time   CHOL 159 05/17/2017 0921   TRIG 153 (H) 05/17/2017 0921   HDL 32 (L) 05/17/2017 0921   CHOLHDL 5.0 (H) 05/17/2017 0921   LDLCALC 102 (H) 05/17/2017 7026    Other Studies Reviewed Today:   Assessment and Plan: Patient is a 46 year old M with no past medical history was referred to cardiology clinic for  evaluation of chest pain after ER visit.  # Cardiac chest pain -Patient had substernal exertional chest pain radiating to the left arm for 2 days prior to ER arrival and resolved after sublingual nitroglycerin in the ER. No recurrent chest pains after ER discharge despite lifting heavy weights. No family history of premature ASCVD. No risk factors. EKG showed normal sinus rhythm and no ST-T changes. Obtain 2D echocardiogram and exercise Myoview.  # Elevated blood-pressure reading without diagnosis of HTN -Instructed patient to check his blood pressures at home every day.  He does not have a PCP and resources offered to establish appointment with PCP.  I have spent a total of 41 minutes with patient reviewing chart, EKGs, labs and examining patient as well as establishing an assessment and plan that was discussed with the patient.  > 50% of time was spent in direct patient care.     Medication Adjustments/Labs and Tests Ordered: Current medicines are reviewed at length with the patient today.  Concerns regarding medicines are outlined above.   Tests Ordered: Orders Placed This Encounter  Procedures   NM Myocar Multi W/Spect W/Wall Motion / EF   ECHOCARDIOGRAM COMPLETE    Medication Changes: No orders of the defined types were placed in this encounter.   Disposition:  Follow up prn  Signed, Christo Hain Verne Spurr, MD, 09/08/2022 10:23 AM    [facility] [address] [place], [place] [address]

## 2022-09-08 NOTE — Addendum Note (Signed)
Addended by: Lamar Benes on: 09/08/2022 02:08 PM   Modules accepted: Orders

## 2022-09-14 ENCOUNTER — Ambulatory Visit (HOSPITAL_COMMUNITY)
Admission: RE | Admit: 2022-09-14 | Discharge: 2022-09-14 | Disposition: A | Discharge status: Home / Self Care | Payer: BC Managed Care – PPO | Source: Ambulatory Visit | Attending: Internal Medicine | Admitting: Internal Medicine

## 2022-09-14 DIAGNOSIS — R072 Precordial pain: Secondary | ICD-10-CM | POA: Insufficient documentation

## 2022-09-14 LAB — ECHOCARDIOGRAM COMPLETE
Area-P 1/2: 4.06 cm2
S' Lateral: 2.5 cm

## 2022-09-14 NOTE — Progress Notes (Signed)
*  PRELIMINARY RESULTS* Echocardiogram 2D Echocardiogram has been performed.  Tyrone Baker 09/14/2022, 10:03 AM

## 2022-09-19 ENCOUNTER — Encounter (HOSPITAL_COMMUNITY): Payer: BC Managed Care – PPO

## 2022-09-19 ENCOUNTER — Encounter (HOSPITAL_COMMUNITY): Payer: Self-pay

## 2022-09-19 ENCOUNTER — Ambulatory Visit (HOSPITAL_COMMUNITY): Payer: BC Managed Care – PPO

## 2022-09-23 ENCOUNTER — Telehealth: Payer: Self-pay | Admitting: Internal Medicine

## 2022-09-23 NOTE — Telephone Encounter (Signed)
Patient is returning call to discuss echo results. °

## 2022-09-23 NOTE — Telephone Encounter (Signed)
Tyrone Baker, Tyrone Raven, MD  Berlinda Last, CMA Normal Echo

## 2022-10-28 ENCOUNTER — Encounter (HOSPITAL_COMMUNITY): Payer: Self-pay

## 2022-10-28 ENCOUNTER — Other Ambulatory Visit: Payer: Self-pay

## 2022-10-28 ENCOUNTER — Emergency Department (HOSPITAL_COMMUNITY)
Admission: EM | Admit: 2022-10-28 | Discharge: 2022-10-28 | Disposition: A | Discharge status: Home / Self Care | Payer: BC Managed Care – PPO | Attending: Emergency Medicine | Admitting: Emergency Medicine

## 2022-10-28 DIAGNOSIS — H5712 Ocular pain, left eye: Secondary | ICD-10-CM | POA: Diagnosis not present

## 2022-10-28 MED ORDER — POLYMYXIN B-TRIMETHOPRIM 10000-0.1 UNIT/ML-% OP SOLN
1.0000 [drp] | OPHTHALMIC | Status: DC
  Administered 2022-10-28: 1 [drp] via OPHTHALMIC
  Filled 2022-10-28: qty 10

## 2022-10-28 MED ORDER — TETRACAINE HCL 0.5 % OP SOLN
2.0000 [drp] | Freq: Once | OPHTHALMIC | Status: DC
  Filled 2022-10-28: qty 4

## 2022-10-28 MED ORDER — POLYMYXIN B-TRIMETHOPRIM 10000-0.1 UNIT/ML-% OP SOLN: 2.0000 [drp] | OPHTHALMIC | 0 refills | Status: AC

## 2022-10-28 MED ORDER — FLUORESCEIN SODIUM 1 MG OP STRP
1.0000 | ORAL_STRIP | Freq: Once | OPHTHALMIC | Status: DC
  Filled 2022-10-28: qty 1

## 2022-10-28 NOTE — Discharge Instructions (Signed)
Evaluation of your left eye is overall reassuring.  I have low suspicion for infection and there was no evidence of a corneal abrasion on inspection of your eye.  I did start you on a topical antibiotic to prevent infection.  Recommend that you follow-up with your PCP in 3 to 4 days for reevaluation.

## 2022-10-28 NOTE — ED Triage Notes (Signed)
Complaining of his left eye being swollen and burning. Started today around lunch time. He thinks some of the chemicals at his work is irritating his eye.

## 2022-10-28 NOTE — ED Provider Notes (Signed)
[facility] Provider Note   CSN: [id number] Arrival date & time: 10/28/22  1436     History  Chief Complaint  Patient presents with   Eye Pain   HPI Tyrone Baker is a 47 y.o. male presenting for eye swelling and irritation.  Started yesterday.  Located in the left eye.  Noticed some swelling around his eye particular in his eyelid that was worse today.  Denies visual disturbance and foreign body sensation.  States his eye and surrounding area feels "irritated".  States he was exposed to "quick dry" yesterday at work and there was a lot of dust and he is concerned may be some dust migrated into his eye which is causing the swelling and irritation.  Does not use contact lenses.   Eye Pain       Home Medications Prior to Admission medications   Medication Sig Start Date End Date Taking? Authorizing Provider  trimethoprim-polymyxin b (POLYTRIM) ophthalmic solution Place 2 drops into the left eye every 4 (four) hours for 5 days. 10/28/22 11/02/22 Yes Harriet Pho, PA-C  albuterol (VENTOLIN HFA) 108 (90 Base) MCG/ACT inhaler Inhale 1-2 puffs into the lungs every 6 (six) hours as needed for wheezing or shortness of breath. 09/08/22   Mallipeddi, Vishnu P, MD  hydrocortisone (ANUSOL-HC) 2.5 % rectal cream Place 1 application rectally 2 (two) times daily. Patient taking differently: Place 1 application  rectally 2 (two) times daily as needed for hemorrhoids. 10/12/17   Annitta Needs, NP      Allergies    Patient has no known allergies.    Review of Systems   Review of Systems  Eyes:  Positive for pain.    Physical Exam Updated Vital Signs BP 137/88 (BP Location: Right Arm)   Pulse 83   Temp 98.3 F (36.8 C) (Oral)   Resp 18   Ht 5' 11"$  (1.803 m)   Wt 81.6 kg   SpO2 98%   BMI 25.10 kg/m  Physical Exam Constitutional:      Appearance: Normal appearance.  HENT:     Head: Normocephalic.     Nose: Nose normal.  Eyes:      Extraocular Movements: Extraocular movements intact.     Right eye: Normal extraocular motion and no nystagmus.     Conjunctiva/sclera: Conjunctivae normal.     Right eye: No hemorrhage.    Comments: No evidence of corneal abrasion, foreign body or dendritic lesion upon inspection of the eye with fluorescein stain and Woods lamp  Swelling noted in the superior eyelid of the left eye  Pulmonary:     Effort: Pulmonary effort is normal.  Neurological:     Mental Status: He is alert.  Psychiatric:        Mood and Affect: Mood normal.      Visual Acuity  Right Eye Distance: 20/20 Left Eye Distance: 20/25 Bilateral Distance: 20/25  Right Eye Near: R Near: 20/20 Left Eye Near:  L Near: 20/25 Bilateral Near:  20/25  ED Results / Procedures / Treatments   Labs (all labs ordered are listed, but only abnormal results are displayed) Labs Reviewed - No data to display  EKG None  Radiology No results found.  Procedures Procedures    Medications Ordered in ED Medications  tetracaine (PONTOCAINE) 0.5 % ophthalmic solution 2 drop (has no administration in time range)  fluorescein ophthalmic strip 1 strip (has no administration in time range)  trimethoprim-polymyxin b (POLYTRIM) ophthalmic  solution 2 drop (has no administration in time range)    ED Course/ Medical Decision Making/ A&P                             Medical Decision Making  47 year old male who is well-appearing presenting for left eye irritation and swelling.  Exam notable for swelling in the eyelid but otherwise unremarkable.  DDx includes corneal abrasion, keratitis, subconjunctival hemorrhage, conjunctivitis.  Woods lamp inspection of the eye did not reveal concern for corneal abrasion or any dendritic lesion. Low suspicion for infection. Treated empirically with Polytrim. Advised to follow-up with his PCP for further evaluation in 3 to 4 days.        Final Clinical Impression(s) / ED Diagnoses Final  diagnoses:  Left eye pain    Rx / DC Orders ED Discharge Orders          Ordered    trimethoprim-polymyxin b (POLYTRIM) ophthalmic solution  Every 4 hours        10/28/22 1618              Harriet Pho, PA-C 10/28/22 1620    Milton Ferguson, MD 10/30/22 1152

## 2023-02-13 ENCOUNTER — Other Ambulatory Visit: Payer: Self-pay

## 2023-02-13 ENCOUNTER — Emergency Department (HOSPITAL_COMMUNITY)
Admission: EM | Admit: 2023-02-13 | Discharge: 2023-02-13 | Disposition: A | Discharge status: Home / Self Care | Payer: BC Managed Care – PPO | Attending: Emergency Medicine | Admitting: Emergency Medicine

## 2023-02-13 ENCOUNTER — Encounter (HOSPITAL_COMMUNITY): Payer: Self-pay | Admitting: Emergency Medicine

## 2023-02-13 DIAGNOSIS — J45909 Unspecified asthma, uncomplicated: Secondary | ICD-10-CM | POA: Insufficient documentation

## 2023-02-13 DIAGNOSIS — H01001 Unspecified blepharitis right upper eyelid: Secondary | ICD-10-CM | POA: Insufficient documentation

## 2023-02-13 DIAGNOSIS — H01004 Unspecified blepharitis left upper eyelid: Secondary | ICD-10-CM | POA: Diagnosis not present

## 2023-02-13 DIAGNOSIS — H02841 Edema of right upper eyelid: Secondary | ICD-10-CM | POA: Diagnosis not present

## 2023-02-13 MED ORDER — OLOPATADINE HCL 0.2 % OP SOLN: 1.0000 [drp] | Freq: Every day | OPHTHALMIC | 0 refills | Status: DC

## 2023-02-13 NOTE — Discharge Instructions (Addendum)
Continue to use the antibiotic drops.  Warm soaks may also help.  Start the allergy medicine.  Return for worsening swelling or pain. Your blood pressure was also elevated, which will need to be followed.

## 2023-02-13 NOTE — ED Provider Notes (Signed)
[place] EMERGENCY DEPARTMENT AT [facility] Provider Note   CSN: [id number] Arrival date & time: 02/13/23  1401     History  Chief Complaint  Patient presents with   Facial Swelling    Tyrone Baker is a 47 y.o. male.  HPI Patient presents with swollen right eyelid.  Has had for around 4 days.  Has been taking his antibiotic drops that he had from previous episode.  States there is clear drainage.  No difficulty seeing.  No fevers.  States he thinks it may be exposed to something at work.  States he does have history of allergies.   Past Medical History:  Diagnosis Date   Asthma    Pericarditis     Home Medications Prior to Admission medications   Medication Sig Start Date End Date Taking? Authorizing Provider  Olopatadine HCl 0.2 % SOLN Apply 1 drop to eye daily. 02/13/23  Yes Benjiman Core, MD  albuterol (VENTOLIN HFA) 108 (90 Base) MCG/ACT inhaler Inhale 1-2 puffs into the lungs every 6 (six) hours as needed for wheezing or shortness of breath. 09/08/22   Mallipeddi, Vishnu P, MD  hydrocortisone (ANUSOL-HC) 2.5 % rectal cream Place 1 application rectally 2 (two) times daily. Patient taking differently: Place 1 application  rectally 2 (two) times daily as needed for hemorrhoids. 10/12/17   Gelene Mink, NP      Allergies    Patient has no known allergies.    Review of Systems   Review of Systems  Physical Exam Updated Vital Signs BP (!) 137/93 (BP Location: Right Arm)   Pulse 86   Temp 98.2 F (36.8 C) (Oral)   Resp 16   Ht 5\' 11"  (1.803 m)   Wt 83.9 kg   SpO2 97%   BMI 25.80 kg/m  Physical Exam Vitals and nursing note reviewed.  HENT:     Head: Atraumatic.  Eyes:     Extraocular Movements: Extraocular movements intact.     Comments: Swelling of the upper lid more laterally of the right eye.  Some clear drainage.  No conjunctival injection.  Eye was intact.  No proptosis.  No purulence.  Mild erythema.  Musculoskeletal:        General:  Tenderness present.  Skin:    Capillary Refill: Capillary refill takes less than 2 seconds.  Neurological:     Mental Status: He is alert and oriented to person, place, and time.     ED Results / Procedures / Treatments   Labs (all labs ordered are listed, but only abnormal results are displayed) Labs Reviewed - No data to display  EKG None  Radiology No results found.  Procedures Procedures    Medications Ordered in ED Medications - No data to display  ED Course/ Medical Decision Making/ A&P                             Medical Decision Making  Patient with swollen upper lip.  Potentially blepharitis.  Will continue antibiotic drops.  Also will add allergy drops.  Doubt severe infection such as orbital cellulitis.  Reviewed previous ER note.  Appears stable for discharge home.  Also has hypertension here which will need to be followed.        Final Clinical Impression(s) / ED Diagnoses Final diagnoses:  Blepharitis of right upper eyelid, unspecified type    Rx / DC Orders ED Discharge Orders  Ordered    Olopatadine HCl 0.2 % SOLN  Daily        02/13/23 1513              Benjiman Core, MD 02/13/23 1516

## 2023-02-13 NOTE — ED Triage Notes (Signed)
Pt c/o swelling and itchiness to right eye, upper and lower lid swollen since Thursday. No known exposure to irritant or allergen. No additional complaints or concerns.

## 2023-02-15 ENCOUNTER — Telehealth: Payer: BC Managed Care – PPO | Admitting: Nurse Practitioner

## 2023-02-15 DIAGNOSIS — H01001 Unspecified blepharitis right upper eyelid: Secondary | ICD-10-CM

## 2023-02-15 MED ORDER — CEPHALEXIN 500 MG PO CAPS: 500.0000 mg | ORAL_CAPSULE | Freq: Four times a day (QID) | ORAL | 0 refills | Status: AC

## 2023-02-15 MED ORDER — OFLOXACIN 0.3 % OP SOLN: 1.0000 [drp] | Freq: Four times a day (QID) | OPHTHALMIC | 0 refills | Status: AC

## 2023-02-15 NOTE — Progress Notes (Signed)
Virtual Visit Consent   Tyrone Baker, you are scheduled for a virtual visit with a [facility] provider today. Just as with appointments in the office, your consent must be obtained to participate. Your consent will be active for this visit and any virtual visit you may have with one of our providers in the next 365 days. If you have a MyChart account, a copy of this consent can be sent to you electronically.  As this is a virtual visit, video technology does not allow for your provider to perform a traditional examination. This may limit your provider's ability to fully assess your condition. If your provider identifies any concerns that need to be evaluated in person or the need to arrange testing (such as labs, EKG, etc.), we will make arrangements to do so. Although advances in technology are sophisticated, we cannot ensure that it will always work on either your end or our end. If the connection with a video visit is poor, the visit may have to be switched to a telephone visit. With either a video or telephone visit, we are not always able to ensure that we have a secure connection.  By engaging in this virtual visit, you consent to the provision of healthcare and authorize for your insurance to be billed (if applicable) for the services provided during this visit. Depending on your insurance coverage, you may receive a charge related to this service.  I need to obtain your verbal consent now. Are you willing to proceed with your visit today? [name] has provided verbal consent on 02/15/2023 for a virtual visit ( telephone). Viviano Simas, FNP  Date: 02/15/2023 3:03 PM  Virtual Visit via Video Note   I, Viviano Simas, connected with  Tyrone Baker  ([id number], February 23, 1976) on 02/15/23 at  3:15 PM EDT by a video-enabled telemedicine application and verified that I am speaking with the correct person using two identifiers.  Location: Patient: Virtual Visit Location Patient: Home Provider:  Virtual Visit Location Provider: Home Office   I discussed the limitations of evaluation and management by telemedicine and the availability of in person appointments. The patient expressed understanding and agreed to proceed.    History of Present Illness: Tyrone Baker is a 47 y.o. who identifies as a male who was assigned male at birth, and is being seen today for swelling of right eyelid with drainage.    Symptom onset was 6 days ago  Patient was treated for blepharitis in ED @ [facility] 2 days ago was given allergy eye drops at that time and told to continue the poly-trim he was using from prior infection (old abx drops from months prior)   He has not had any improvement since that time Of note he had an occurrence of this in February as well in the left eye  History of allergies   He is unsure what the irritant was at this time  Wife is also on call and confirms that at times the redness and swelling is extending under his eye aw well   Problems:  Patient Active Problem List   Diagnosis Date Noted   Cardiac chest pain 09/08/2022   Elevated blood pressure reading in office without diagnosis of hypertension 09/08/2022   Rectal bleeding 10/12/2017   Asthma in adult 05/17/2017    Allergies: No Known Allergies Medications:  Current Outpatient Medications:    albuterol (VENTOLIN HFA) 108 (90 Base) MCG/ACT inhaler, Inhale 1-2 puffs into the lungs every 6 (six)  hours as needed for wheezing or shortness of breath., Disp: 2 each, Rfl: 0   hydrocortisone (ANUSOL-HC) 2.5 % rectal cream, Place 1 application rectally 2 (two) times daily. (Patient taking differently: Place 1 application  rectally 2 (two) times daily as needed for hemorrhoids.), Disp: 30 g, Rfl: 1   Olopatadine HCl 0.2 % SOLN, Apply 1 drop to eye daily., Disp: 2.5 mL, Rfl: 0  Observations/Objective:  No labored breathing.  Speech is clear and coherent with logical content.  Patient is alert and oriented at baseline.   Unable to connect to video/ audio only call   Assessment and Plan: 1. Blepharitis of right upper eyelid, unspecified type Concern for orbital cellulitis  - cephALEXin (KEFLEX) 500 MG capsule; Take 1 capsule (500 mg total) by mouth 4 (four) times daily for 5 days.  Dispense: 20 capsule; Refill: 0 - ofloxacin (OCUFLOX) 0.3 % ophthalmic solution; Place 1 drop into the right eye 4 (four) times daily for 5 days.  Dispense: 5 mL; Refill: 0    Warm compress as needed  Stop poly trim antibiotic drops   May continue allergy eye drops as needed  Strict follow up precautions if no improvement in the next 24-48 hours   Follow Up Instructions: I discussed the assessment and treatment plan with the patient. The patient was provided an opportunity to ask questions and all were answered. The patient agreed with the plan and demonstrated an understanding of the instructions.  A copy of instructions were sent to the patient via MyChart unless otherwise noted below.    The patient was advised to call back or seek an in-person evaluation if the symptoms worsen or if the condition fails to improve as anticipated.  Time:  I spent 15 minutes with the patient via telehealth technology discussing the above problems/concerns.    Viviano Simas, FNP

## 2023-08-24 ENCOUNTER — Emergency Department (HOSPITAL_COMMUNITY): Payer: Worker's Compensation

## 2023-08-24 ENCOUNTER — Other Ambulatory Visit: Payer: Self-pay

## 2023-08-24 ENCOUNTER — Emergency Department (HOSPITAL_COMMUNITY)
Admission: EM | Admit: 2023-08-24 | Discharge: 2023-08-24 | Disposition: A | Discharge status: Home / Self Care | Payer: Worker's Compensation | Attending: Emergency Medicine | Admitting: Emergency Medicine

## 2023-08-24 DIAGNOSIS — Z79899 Other long term (current) drug therapy: Secondary | ICD-10-CM | POA: Insufficient documentation

## 2023-08-24 DIAGNOSIS — M79602 Pain in left arm: Secondary | ICD-10-CM | POA: Diagnosis not present

## 2023-08-24 DIAGNOSIS — I10 Essential (primary) hypertension: Secondary | ICD-10-CM | POA: Insufficient documentation

## 2023-08-24 DIAGNOSIS — R079 Chest pain, unspecified: Secondary | ICD-10-CM | POA: Diagnosis not present

## 2023-08-24 DIAGNOSIS — R0789 Other chest pain: Secondary | ICD-10-CM | POA: Diagnosis not present

## 2023-08-24 LAB — BASIC METABOLIC PANEL
Anion gap: 8 (ref 5–15)
BUN: 14 mg/dL (ref 6–20)
CO2: 23 mmol/L (ref 22–32)
Calcium: 9.1 mg/dL (ref 8.9–10.3)
Chloride: 108 mmol/L (ref 98–111)
Creatinine, Ser: 1.06 mg/dL (ref 0.61–1.24)
GFR, Estimated: >60 mL/min (ref >60)
Glucose, Bld: 94 mg/dL (ref 70–99)
Potassium: 3.6 mmol/L (ref 3.5–5.1)
Sodium: 139 mmol/L (ref 135–145)

## 2023-08-24 LAB — CBC
HCT: 40.9 % (ref 39.0–52.0)
Hemoglobin: 14 g/dL (ref 13.0–17.0)
MCH: 31.2 pg (ref 26.0–34.0)
MCHC: 34.2 g/dL (ref 30.0–36.0)
MCV: 91.1 fL (ref 80.0–100.0)
Platelets: 248 10*3/uL (ref 150–400)
RBC: 4.49 MIL/uL (ref 4.22–5.81)
RDW: 12.2 % (ref 11.5–15.5)
WBC: 5.2 10*3/uL (ref 4.0–10.5)
nRBC: 0 % (ref 0.0–0.2)

## 2023-08-24 LAB — TROPONIN I (HIGH SENSITIVITY)
Troponin I (High Sensitivity): 2 ng/L (ref <18)
Troponin I (High Sensitivity): 3 ng/L (ref <18)

## 2023-08-24 MED ORDER — NAPROXEN 375 MG PO TABS: 375.0000 mg | ORAL_TABLET | Freq: Two times a day (BID) | ORAL | 0 refills | Status: AC

## 2023-08-24 MED ORDER — METHOCARBAMOL 500 MG PO TABS: 500.0000 mg | ORAL_TABLET | Freq: Two times a day (BID) | ORAL | 0 refills | Status: AC | PRN

## 2023-08-24 NOTE — ED Provider Notes (Signed)
[place] EMERGENCY DEPARTMENT AT [facility] Provider Note   CSN: [id number] Arrival date & time: 08/24/23  1055     History  Chief Complaint  Patient presents with   Chest Pain    Tyrone Baker is a 47 y.o. male.  He is here with complaints of left arm pain radiating into his chest that is been going on since about 10:45 AM today.  He is left-hand dominant and he works doing some heavy [employer] work.  He said he was doing this when he began experiencing the pain.  He had to stop and go to his bosses office who brought him in here for further evaluation.  He has had some ongoing problems with his arm and shoulder.  He was seen about a year ago for atypical chest pain and followed up with cardiology.  He had a follow-up echo with normal EF and no wall motion abnormalities.  Was supposed to get a nuclear study but I cannot see that he actually completed that.  The history is provided by the patient.  Chest Pain Pain location:  L chest Pain quality: sharp and stabbing   Pain radiates to:  L arm and L shoulder Pain severity:  Severe Onset quality:  Sudden Duration:  2 hours Timing:  Intermittent Progression:  Partially resolved Chronicity:  Recurrent Relieved by:  None tried Worsened by:  Movement Ineffective treatments:  Rest Associated symptoms: shortness of breath   Associated symptoms: no abdominal pain, no cough, no diaphoresis, no fever, no nausea, no numbness, no vomiting and no weakness        Home Medications Prior to Admission medications   Medication Sig Start Date End Date Taking? Authorizing Provider  albuterol (VENTOLIN HFA) 108 (90 Base) MCG/ACT inhaler Inhale 1-2 puffs into the lungs every 6 (six) hours as needed for wheezing or shortness of breath. 09/08/22   Mallipeddi, Vishnu P, MD  hydrocortisone (ANUSOL-HC) 2.5 % rectal cream Place 1 application rectally 2 (two) times daily. Patient taking differently: Place 1 application  rectally 2 (two)  times daily as needed for hemorrhoids. 10/12/17   Gelene Mink, NP  Olopatadine HCl 0.2 % SOLN Apply 1 drop to eye daily. 02/13/23   Benjiman Core, MD      Allergies    Patient has no known allergies.    Review of Systems   Review of Systems  Constitutional:  Negative for diaphoresis and fever.  Eyes:  Negative for visual disturbance.  Respiratory:  Positive for shortness of breath. Negative for cough.   Cardiovascular:  Positive for chest pain.  Gastrointestinal:  Negative for abdominal pain, nausea and vomiting.  Neurological:  Negative for weakness and numbness.    Physical Exam Updated Vital Signs BP (!) 133/105   Pulse 83   Temp 98.1 F (36.7 C) (Oral)   Resp 15   Ht 5\' 11"  (1.803 m)   Wt 81.6 kg   SpO2 97%   BMI 25.10 kg/m  Physical Exam Vitals and nursing note reviewed.  Constitutional:      General: He is not in acute distress.    Appearance: Normal appearance. He is well-developed.  HENT:     Head: Normocephalic and atraumatic.  Eyes:     Conjunctiva/sclera: Conjunctivae normal.  Cardiovascular:     Rate and Rhythm: Normal rate and regular rhythm.     Heart sounds: Normal heart sounds. No murmur heard. Pulmonary:     Effort: Pulmonary effort is normal. No respiratory distress.  Breath sounds: Normal breath sounds.  Abdominal:     Palpations: Abdomen is soft.     Tenderness: There is no abdominal tenderness. There is no guarding or rebound.  Musculoskeletal:        General: No swelling. Normal range of motion.     Cervical back: Neck supple.     Right lower leg: No tenderness. No edema.     Left lower leg: No tenderness. No edema.  Skin:    General: Skin is warm and dry.     Capillary Refill: Capillary refill takes less than 2 seconds.  Neurological:     General: No focal deficit present.     Mental Status: He is alert.     Sensory: No sensory deficit.     Motor: No weakness.  Psychiatric:        Mood and Affect: Mood normal.     ED  Results / Procedures / Treatments   Labs (all labs ordered are listed, but only abnormal results are displayed) Labs Reviewed  BASIC METABOLIC PANEL  CBC  TROPONIN I (HIGH SENSITIVITY)  TROPONIN I (HIGH SENSITIVITY)    EKG EKG Interpretation Date/Time:  Thursday August 24 2023 11:10:11 EST Ventricular Rate:  96 PR Interval:  172 QRS Duration:  84 QT Interval:  316 QTC Calculation: 399 R Axis:   80  Text Interpretation: Normal sinus rhythm Anterior infarct , age undetermined Abnormal ECG When compared with ECG of 26-Aug-2022 07:46, rate is faster now Confirmed by Meridee Score [phone]) on 08/24/2023 11:11:27 AM  Radiology DG Chest 2 View Result Date: 08/24/2023 CLINICAL DATA:  Chest pain.  Shortness of breath. EXAM: CHEST - 2 VIEW COMPARISON:  08/26/2022. FINDINGS: Bilateral lung fields are clear. Bilateral costophrenic angles are clear. Normal cardio-mediastinal silhouette. No acute osseous abnormalities. The soft tissues are within normal limits. IMPRESSION: No active cardiopulmonary disease. Electronically Signed   By: Jules Schick M.D.   On: 08/24/2023 11:45    Procedures Procedures    Medications Ordered in ED Medications - No data to display  ED Course/ Medical Decision Making/ A&P Clinical Course as of 08/24/23 2026  Thu Aug 24, 2023  1438 Patient's pain is improving and his workup is negative for any ACS.  He is comfortable plan for treatment with NSAIDs muscle relaxant and follow-up with orthopedics.  Return instructions discussed [MB]    Clinical Course User Index [MB] Terrilee Files, MD                                 Medical Decision Making Amount and/or Complexity of Data Reviewed Labs: ordered. Radiology: ordered.  Risk Prescription drug management.   This patient complains of left arm and chest pain; this involves an extensive number of treatment Options and is a complaint that carries with it a high risk of complications and morbidity.  The differential includes musculoskeletal pain, cardiac, PE, pneumothorax, reflux  I ordered, reviewed and interpreted labs, which included CBC normal chemistries normal troponins flat I ordered imaging studies which included chest x-ray and I independently    visualized and interpreted imaging which showed no acute findings Additional history obtained from patient's wife Previous records obtained and reviewed in epic no recent admissions Cardiac monitoring reviewed, sinus rhythm Social determinants considered, no significant barriers Critical Interventions: None  After the interventions stated above, I reevaluated the patient and found patient to be symptomatically improved Admission and further testing considered,  no indications for admission for cardiac workup at this time.  Will treat with NSAIDs muscle relaxants and close follow-up with orthopedics.  Also counseled patient that his blood pressure is elevated he will likely need further evaluation for this.  Return instructions discussed.         Final Clinical Impression(s) / ED Diagnoses Final diagnoses:  Nonspecific chest pain  Left arm pain  Primary hypertension    Rx / DC Orders ED Discharge Orders          Ordered    naproxen (NAPROSYN) 375 MG tablet  2 times daily        08/24/23 1447    methocarbamol (ROBAXIN) 500 MG tablet  2 times daily PRN        08/24/23 1447              Terrilee Files, MD 08/24/23 2027

## 2023-08-24 NOTE — ED Notes (Signed)
See triage notes. Pt states had sudden pain up left arm into left chest that is sharp and shooting. Pain is worse with ROM during assessment. Pt states did have brief dizziness at the time but none since. Pt states was also shob and still "a little" shob now. No resp distress/obvious shob noted. A/o. Color wnl. Non diaphoretic. Denies fevers.

## 2023-08-24 NOTE — Discharge Instructions (Addendum)
You were seen in the emergency department for pain in your left arm going into your chest.  You had blood work EKG chest x-ray that did not show an obvious sign of heart injury.  Your blood pressure is elevated here.  We are starting you on some anti-inflammatory medication for your arm and chest.  You can also use ice or heat to the affected area.  You will need to get your blood pressure rechecked by primary care doctor and your arm evaluated by orthopedics.  Return to the emergency department if any worsening or concerning symptoms

## 2023-08-24 NOTE — ED Triage Notes (Signed)
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  + SOB.  Hx of pericarditis.
